# Patient Record
Sex: Female | Born: 1995 | Race: Asian | Hispanic: No | Marital: Single | Smoking: Current every day smoker
Health system: Southern US, Community
[De-identification: ages and names within clinical notes are randomized; demographics above are authoritative.]

## PROBLEM LIST (undated history)

## (undated) ENCOUNTER — Ambulatory Visit

## (undated) ENCOUNTER — Inpatient Hospital Stay (HOSPITAL_COMMUNITY): Payer: Self-pay

## (undated) DIAGNOSIS — Z789 Other specified health status: Secondary | ICD-10-CM

## (undated) HISTORY — PX: MOUTH SURGERY: SHX715

---
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
## 2017-01-12 ENCOUNTER — Encounter: Payer: Medicaid Other | Admitting: Obstetrics and Gynecology

## 2017-01-13 ENCOUNTER — Ambulatory Visit (INDEPENDENT_AMBULATORY_CARE_PROVIDER_SITE_OTHER): Payer: Medicaid Other | Admitting: Obstetrics

## 2017-01-13 DIAGNOSIS — Z3480 Encounter for supervision of other normal pregnancy, unspecified trimester: Secondary | ICD-10-CM

## 2017-01-13 DIAGNOSIS — Z3482 Encounter for supervision of other normal pregnancy, second trimester: Secondary | ICD-10-CM
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
## 2017-01-14 ENCOUNTER — Encounter: Payer: Self-pay | Admitting: Obstetrics

## 2017-01-14 NOTE — Progress Notes (Signed)
Subjective:  Emma Wilcox is a 21 y.o. G3P0020 at [redacted]w[redacted]d being seen today for ongoing prenatal care.  She is currently monitored for the following issues for this low-risk pregnancy and has Supervision of other normal pregnancy, antepartum on her problem list.  Patient reports no complaints.  Contractions: Not present. Vag. Bleeding: None.  Movement: Present. Denies leaking of fluid.   The following portions of the patient's history were reviewed and updated as appropriate: allergies, current medications, past family history, past medical history, past social history, past surgical history and problem list. Problem list updated.  Objective:   Vitals:   01/13/17 1343  BP: 117/68  Pulse: 93  Weight: 162 lb 12.8 oz (73.8 kg)    Fetal Status: Fetal Heart Rate (bpm): 150   Movement: Present     General:  Alert, oriented and cooperative. Patient is in no acute distress.  Skin: Skin is warm and dry. No rash noted.   Cardiovascular: Normal heart rate noted  Respiratory: Normal respiratory effort, no problems with respiration noted  Abdomen: Soft, gravid, appropriate for gestational age. Pain/Pressure: Absent     Pelvic:  Cervical exam deferred        Extremities: Normal range of motion.  Edema: None  Mental Status: Normal mood and affect. Normal behavior. Normal judgment and thought content.   Urinalysis:      Assessment and Plan:  Pregnancy: G3P0020 at [redacted]w[redacted]d  1. Supervision of other normal pregnancy, antepartum   Preterm labor symptoms and general obstetric precautions including but not limited to vaginal bleeding, contractions, leaking of fluid and fetal movement were reviewed in detail with the patient. Please refer to After Visit Summary for other counseling recommendations.  Return in about 2 weeks (around 01/27/2017) for ROB, 2 hour OGTT.   Brock Bad, MD

## 2017-01-27 ENCOUNTER — Encounter: Payer: Medicaid Other | Admitting: Obstetrics

## 2017-01-27 ENCOUNTER — Other Ambulatory Visit: Payer: Medicaid Other

## 2017-02-02 ENCOUNTER — Other Ambulatory Visit: Payer: Medicaid Other

## 2017-02-02 ENCOUNTER — Encounter: Payer: Medicaid Other | Admitting: Obstetrics

## 2017-02-03 ENCOUNTER — Encounter: Payer: Self-pay | Admitting: Obstetrics

## 2017-02-03 ENCOUNTER — Other Ambulatory Visit: Payer: Medicaid Other

## 2017-02-03 ENCOUNTER — Ambulatory Visit (INDEPENDENT_AMBULATORY_CARE_PROVIDER_SITE_OTHER): Payer: Medicaid Other | Admitting: Obstetrics

## 2017-02-03 VITALS — BP 110/73 | HR 105 | Wt 167.2 lb

## 2017-02-03 DIAGNOSIS — Z3480 Encounter for supervision of other normal pregnancy, unspecified trimester: Secondary | ICD-10-CM

## 2017-02-03 DIAGNOSIS — K219 Gastro-esophageal reflux disease without esophagitis: Secondary | ICD-10-CM

## 2017-02-03 DIAGNOSIS — Z3483 Encounter for supervision of other normal pregnancy, third trimester: Secondary | ICD-10-CM

## 2017-02-03 MED ORDER — OMEPRAZOLE 20 MG PO CPDR: 20.0000 mg | DELAYED_RELEASE_CAPSULE | Freq: Two times a day (BID) | ORAL | 5 refills | Status: DC
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
## 2017-02-03 NOTE — Addendum Note (Signed)
Addended by: Lear NgMARTIN, MISTY L on: 02/03/2017 01:27 PM   Modules accepted: Orders

## 2017-02-03 NOTE — Progress Notes (Signed)
Subjective:  Emma Wilcox is a 21 y.o. G3P0020 at 262w2d being seen today for ongoing prenatal care.  She is currently monitored for the following issues for this low-risk pregnancy and has Supervision of other normal pregnancy, antepartum on her problem list.  Patient reports heartburn.  Contractions: Not present. Vag. Bleeding: None.  Movement: Present. Denies leaking of fluid.   The following portions of the patient's history were reviewed and updated as appropriate: allergies, current medications, past family history, past medical history, past social history, past surgical history and problem list. Problem list updated.  Objective:   Vitals:   02/03/17 0840  BP: 110/73  Pulse: (!) 105  Weight: 167 lb 3.2 oz (75.8 kg)    Fetal Status:     Movement: Present     General:  Alert, oriented and cooperative. Patient is in no acute distress.  Skin: Skin is warm and dry. No rash noted.   Cardiovascular: Normal heart rate noted  Respiratory: Normal respiratory effort, no problems with respiration noted  Abdomen: Soft, gravid, appropriate for gestational age. Pain/Pressure: Absent     Pelvic:  Cervical exam deferred        Extremities: Normal range of motion.  Edema: None  Mental Status: Normal mood and affect. Normal behavior. Normal judgment and thought content.   Urinalysis:      Assessment and Plan:  Pregnancy: G3P0020 at [redacted]w[redacted]d  1. Supervision of other normal pregnancy, antepartum Rx: - CBC - Glucose Tolerance, 2 Hours w/1 Hour - HIV antibody - RPR  2. SGA (small for gestational age) Rx: - US MFM OB FOLLOW UP; Future  3. GERD without esophagitis Rx: - omeprazole (PRILOSEC) 20 MG capsule; Take 1 capsule (20 mg total) by mouth 2 (two) times daily before a meal.  Dispense: 60 capsule; Refill: 5   Preterm labor symptoms and general obstetric precautions including but not limited to vaginal bleeding, contractions, leaking of fluid and fetal movement were reviewed in detail with  the patient. Please refer to After Visit Summary for other counseling recommendations.  Return in about 2 weeks (around 02/17/2017) for ROB.   Brock BadHarper, Suriya Kovarik A, MD

## 2017-02-04 LAB — CBC
Hematocrit: 35.4 % (ref 34.0–46.6)
Hemoglobin: 11.6 g/dL (ref 11.1–15.9)
MCH: 32 pg (ref 26.6–33.0)
MCHC: 32.8 g/dL (ref 31.5–35.7)
MCV: 98 fL — ABNORMAL HIGH (ref 79–97)
PLATELETS: 268 10*3/uL (ref 150–379)
RBC: 3.62 x10E6/uL — AB (ref 3.77–5.28)
RDW: 14.4 % (ref 12.3–15.4)
WBC: 12 10*3/uL — AB (ref 3.4–10.8)

## 2017-02-04 LAB — RPR: RPR Ser Ql: NONREACTIVE

## 2017-02-04 LAB — HIV ANTIBODY (ROUTINE TESTING W REFLEX): HIV SCREEN 4TH GENERATION: NONREACTIVE

## 2017-02-04 LAB — GLUCOSE, RANDOM: Glucose: 75 mg/dL (ref 65–99)

## 2017-02-08 ENCOUNTER — Other Ambulatory Visit: Payer: Self-pay | Admitting: Obstetrics

## 2017-02-08 ENCOUNTER — Ambulatory Visit (HOSPITAL_COMMUNITY)
Admission: RE | Admit: 2017-02-08 | Discharge: 2017-02-08 | Disposition: A | Discharge status: Home / Self Care | Payer: Medicaid Other | Source: Ambulatory Visit | Attending: Obstetrics | Admitting: Obstetrics

## 2017-02-08 ENCOUNTER — Other Ambulatory Visit (HOSPITAL_COMMUNITY): Payer: Self-pay | Admitting: *Deleted

## 2017-02-08 ENCOUNTER — Encounter (HOSPITAL_COMMUNITY): Payer: Self-pay

## 2017-02-08 DIAGNOSIS — Z362 Encounter for other antenatal screening follow-up: Secondary | ICD-10-CM | POA: Diagnosis present

## 2017-02-08 DIAGNOSIS — O99333 Smoking (tobacco) complicating pregnancy, third trimester: Secondary | ICD-10-CM | POA: Diagnosis not present

## 2017-02-08 DIAGNOSIS — Z3A30 30 weeks gestation of pregnancy: Secondary | ICD-10-CM | POA: Insufficient documentation

## 2017-02-08 DIAGNOSIS — O26843 Uterine size-date discrepancy, third trimester: Secondary | ICD-10-CM | POA: Insufficient documentation

## 2017-02-08 DIAGNOSIS — Z3689 Encounter for other specified antenatal screening: Secondary | ICD-10-CM

## 2017-03-01 ENCOUNTER — Ambulatory Visit (HOSPITAL_COMMUNITY): Admission: RE | Admit: 2017-03-01 | Payer: Medicaid Other | Source: Ambulatory Visit

## 2017-03-05 ENCOUNTER — Encounter (HOSPITAL_COMMUNITY): Payer: Self-pay

## 2017-03-05 ENCOUNTER — Ambulatory Visit (HOSPITAL_COMMUNITY)
Admission: RE | Admit: 2017-03-05 | Discharge: 2017-03-05 | Disposition: A | Discharge status: Home / Self Care | Payer: Medicaid Other | Source: Ambulatory Visit | Attending: Obstetrics | Admitting: Obstetrics

## 2017-03-05 ENCOUNTER — Other Ambulatory Visit (HOSPITAL_COMMUNITY): Payer: Self-pay | Admitting: Maternal and Fetal Medicine

## 2017-03-05 DIAGNOSIS — O26843 Uterine size-date discrepancy, third trimester: Secondary | ICD-10-CM | POA: Insufficient documentation

## 2017-03-05 DIAGNOSIS — Z3A33 33 weeks gestation of pregnancy: Secondary | ICD-10-CM | POA: Diagnosis not present

## 2017-03-05 DIAGNOSIS — Z3480 Encounter for supervision of other normal pregnancy, unspecified trimester: Secondary | ICD-10-CM

## 2017-03-08 ENCOUNTER — Telehealth: Payer: Self-pay

## 2017-03-08 ENCOUNTER — Other Ambulatory Visit (HOSPITAL_COMMUNITY): Payer: Self-pay | Admitting: *Deleted

## 2017-03-08 NOTE — Telephone Encounter (Signed)
Pt last seen 10/17. Pt never completed 2 hr gtt. Called pt to schedule follow up and lab.

## 2017-03-09 ENCOUNTER — Encounter: Payer: Medicaid Other | Admitting: Obstetrics

## 2017-03-09 ENCOUNTER — Other Ambulatory Visit: Payer: Medicaid Other

## 2017-03-12 ENCOUNTER — Other Ambulatory Visit (HOSPITAL_COMMUNITY): Payer: Self-pay | Admitting: Maternal and Fetal Medicine

## 2017-03-12 ENCOUNTER — Ambulatory Visit (HOSPITAL_COMMUNITY)
Admission: RE | Admit: 2017-03-12 | Discharge: 2017-03-12 | Disposition: A | Discharge status: Home / Self Care | Payer: Medicaid Other | Source: Ambulatory Visit | Attending: Obstetrics | Admitting: Obstetrics

## 2017-03-12 ENCOUNTER — Encounter (HOSPITAL_COMMUNITY): Payer: Self-pay

## 2017-03-12 DIAGNOSIS — O99333 Smoking (tobacco) complicating pregnancy, third trimester: Secondary | ICD-10-CM | POA: Diagnosis present

## 2017-03-12 DIAGNOSIS — O26843 Uterine size-date discrepancy, third trimester: Secondary | ICD-10-CM | POA: Insufficient documentation

## 2017-03-12 DIAGNOSIS — Z3A34 34 weeks gestation of pregnancy: Secondary | ICD-10-CM | POA: Diagnosis not present

## 2017-03-12 DIAGNOSIS — O365930 Maternal care for other known or suspected poor fetal growth, third trimester, not applicable or unspecified: Secondary | ICD-10-CM

## 2017-03-12 DIAGNOSIS — Z3480 Encounter for supervision of other normal pregnancy, unspecified trimester: Secondary | ICD-10-CM

## 2017-03-12 DIAGNOSIS — O09893 Supervision of other high risk pregnancies, third trimester: Secondary | ICD-10-CM | POA: Insufficient documentation

## 2017-03-18 ENCOUNTER — Ambulatory Visit (INDEPENDENT_AMBULATORY_CARE_PROVIDER_SITE_OTHER): Payer: Medicaid Other | Admitting: Obstetrics

## 2017-03-18 ENCOUNTER — Encounter: Payer: Self-pay | Admitting: Obstetrics

## 2017-03-18 ENCOUNTER — Other Ambulatory Visit (HOSPITAL_COMMUNITY)
Admission: RE | Admit: 2017-03-18 | Discharge: 2017-03-18 | Disposition: A | Discharge status: Home / Self Care | Payer: Medicaid Other | Source: Ambulatory Visit | Attending: Obstetrics | Admitting: Obstetrics

## 2017-03-18 ENCOUNTER — Other Ambulatory Visit: Payer: Medicaid Other

## 2017-03-18 VITALS — BP 133/72 | HR 98 | Wt 178.0 lb

## 2017-03-18 DIAGNOSIS — O0993 Supervision of high risk pregnancy, unspecified, third trimester: Secondary | ICD-10-CM

## 2017-03-18 DIAGNOSIS — O0990 Supervision of high risk pregnancy, unspecified, unspecified trimester: Secondary | ICD-10-CM

## 2017-03-18 DIAGNOSIS — N898 Other specified noninflammatory disorders of vagina: Secondary | ICD-10-CM | POA: Insufficient documentation

## 2017-03-18 DIAGNOSIS — Z3480 Encounter for supervision of other normal pregnancy, unspecified trimester: Secondary | ICD-10-CM | POA: Diagnosis not present

## 2017-03-18 DIAGNOSIS — Z113 Encounter for screening for infections with a predominantly sexual mode of transmission: Secondary | ICD-10-CM

## 2017-03-18 LAB — OB RESULTS CONSOLE GBS: STREP GROUP B AG: NEGATIVE

## 2017-03-18 LAB — OB RESULTS CONSOLE GC/CHLAMYDIA: GC PROBE AMP, GENITAL: NEGATIVE

## 2017-03-18 NOTE — Progress Notes (Signed)
Patient reports good fetal movement, denies pain. 

## 2017-03-18 NOTE — Progress Notes (Addendum)
Subjective:  Emma Wilcox is a 21 y.o. G3P0020 at 2184w3d being seen today for ongoing prenatal care.  She is currently monitored for the following issues for this low-risk pregnancy and has Supervision of other normal pregnancy, antepartum on their problem list.  Patient reports heartburn.  Contractions: Not present. Vag. Bleeding: None.  Movement: Present. Denies leaking of fluid.   The following portions of the patient's history were reviewed and updated as appropriate: allergies, current medications, past family history, past medical history, past social history, past surgical history and problem list. Problem list updated.  Objective:   Vitals:   03/18/17 1007  BP: 133/72  Pulse: 98  Weight: 178 lb (80.7 kg)    Fetal Status: Fetal Heart Rate (bpm): 150   Movement: Present     General:  Alert, oriented and cooperative. Patient is in no acute distress.  Skin: Skin is warm and dry. No rash noted.   Cardiovascular: Normal heart rate noted  Respiratory: Normal respiratory effort, no problems with respiration noted  Abdomen: Soft, gravid, appropriate for gestational age. Pain/Pressure: Absent     Pelvic:  Cervical exam deferred        Extremities: Normal range of motion.  Edema: None  Mental Status: Normal mood and affect. Normal behavior. Normal judgment and thought content.   Urinalysis:      Assessment and Plan:  Pregnancy: G3P0020 at 6384w3d  1. Supervision of high risk pregnancy, antepartum Rx: - CBC - Glucose Tolerance, 2 Hours w/1 Hour - Strep Gp B NAA  2. SGA (small for gestational age) - weekly BPP with Dopplers  3. Vaginal discharge Rx: - Cervicovaginal ancillary only  4. Screen for STD (sexually transmitted disease) Rx: - HIV antibody (with reflex) - RPR   Preterm labor symptoms and general obstetric precautions including but not limited to vaginal bleeding, contractions, leaking of fluid and fetal movement were reviewed in detail with the patient. Please refer  to After Visit Summary for other counseling recommendations.  Return in about 2 weeks (around 04/01/2017) for ROB.   Brock BadHarper, Arval Brandstetter A, MD

## 2017-03-19 ENCOUNTER — Encounter (HOSPITAL_COMMUNITY): Payer: Self-pay

## 2017-03-19 ENCOUNTER — Ambulatory Visit (HOSPITAL_COMMUNITY)
Admission: RE | Admit: 2017-03-19 | Discharge: 2017-03-19 | Disposition: A | Discharge status: Home / Self Care | Payer: Medicaid Other | Source: Ambulatory Visit | Attending: Obstetrics | Admitting: Obstetrics

## 2017-03-19 DIAGNOSIS — Z3A35 35 weeks gestation of pregnancy: Secondary | ICD-10-CM | POA: Insufficient documentation

## 2017-03-19 DIAGNOSIS — Z3480 Encounter for supervision of other normal pregnancy, unspecified trimester: Secondary | ICD-10-CM

## 2017-03-19 DIAGNOSIS — O365990 Maternal care for other known or suspected poor fetal growth, unspecified trimester, not applicable or unspecified: Secondary | ICD-10-CM | POA: Diagnosis present

## 2017-03-19 LAB — GLUCOSE TOLERANCE, 2 HOURS W/ 1HR
Glucose, 1 hour: 130 mg/dL (ref 65–179)
Glucose, 2 hour: 130 mg/dL (ref 65–152)
Glucose, Fasting: 75 mg/dL (ref 65–91)

## 2017-03-19 LAB — CERVICOVAGINAL ANCILLARY ONLY
BACTERIAL VAGINITIS: POSITIVE — AB
CANDIDA VAGINITIS: NEGATIVE
CHLAMYDIA, DNA PROBE: NEGATIVE
NEISSERIA GONORRHEA: NEGATIVE
TRICH (WINDOWPATH): NEGATIVE

## 2017-03-19 LAB — CBC
HEMOGLOBIN: 11.5 g/dL (ref 11.1–15.9)
Hematocrit: 34.8 % (ref 34.0–46.6)
MCH: 30.7 pg (ref 26.6–33.0)
MCHC: 33 g/dL (ref 31.5–35.7)
MCV: 93 fL (ref 79–97)
Platelets: 274 10*3/uL (ref 150–379)
RBC: 3.75 x10E6/uL — AB (ref 3.77–5.28)
RDW: 13.6 % (ref 12.3–15.4)
WBC: 12.4 10*3/uL — ABNORMAL HIGH (ref 3.4–10.8)

## 2017-03-19 LAB — HIV ANTIBODY (ROUTINE TESTING W REFLEX): HIV Screen 4th Generation wRfx: NONREACTIVE

## 2017-03-19 LAB — RPR: RPR Ser Ql: NONREACTIVE

## 2017-03-20 ENCOUNTER — Other Ambulatory Visit: Payer: Self-pay | Admitting: Obstetrics

## 2017-03-20 DIAGNOSIS — N760 Acute vaginitis: Principal | ICD-10-CM

## 2017-03-20 DIAGNOSIS — B9689 Other specified bacterial agents as the cause of diseases classified elsewhere: Secondary | ICD-10-CM

## 2017-03-20 LAB — STREP GP B NAA: Strep Gp B NAA: NEGATIVE

## 2017-03-20 MED ORDER — METRONIDAZOLE 500 MG PO TABS: 500.0000 mg | ORAL_TABLET | Freq: Two times a day (BID) | ORAL | 2 refills | Status: DC

## 2017-03-26 ENCOUNTER — Inpatient Hospital Stay (HOSPITAL_COMMUNITY): Admission: RE | Admit: 2017-03-26 | Payer: Medicaid Other | Source: Ambulatory Visit

## 2017-03-31 ENCOUNTER — Ambulatory Visit (INDEPENDENT_AMBULATORY_CARE_PROVIDER_SITE_OTHER): Payer: Medicaid Other | Admitting: Obstetrics

## 2017-03-31 VITALS — BP 125/74 | HR 99 | Wt 182.0 lb

## 2017-03-31 DIAGNOSIS — O0993 Supervision of high risk pregnancy, unspecified, third trimester: Secondary | ICD-10-CM

## 2017-03-31 DIAGNOSIS — O0990 Supervision of high risk pregnancy, unspecified, unspecified trimester: Secondary | ICD-10-CM

## 2017-03-31 NOTE — Progress Notes (Signed)
Pt c/o vaginal pressure.  She states she would like her cervix checked.

## 2017-04-01 ENCOUNTER — Other Ambulatory Visit (HOSPITAL_COMMUNITY): Payer: Self-pay | Admitting: *Deleted

## 2017-04-01 ENCOUNTER — Ambulatory Visit (HOSPITAL_COMMUNITY)
Admission: RE | Admit: 2017-04-01 | Discharge: 2017-04-01 | Disposition: A | Discharge status: Home / Self Care | Payer: Medicaid Other | Source: Ambulatory Visit | Attending: Obstetrics | Admitting: Obstetrics

## 2017-04-01 ENCOUNTER — Other Ambulatory Visit (HOSPITAL_COMMUNITY): Payer: Self-pay | Admitting: Maternal and Fetal Medicine

## 2017-04-01 ENCOUNTER — Encounter: Payer: Self-pay | Admitting: Obstetrics

## 2017-04-01 ENCOUNTER — Encounter (HOSPITAL_COMMUNITY): Payer: Self-pay

## 2017-04-01 DIAGNOSIS — O99333 Smoking (tobacco) complicating pregnancy, third trimester: Secondary | ICD-10-CM | POA: Diagnosis not present

## 2017-04-01 DIAGNOSIS — O365930 Maternal care for other known or suspected poor fetal growth, third trimester, not applicable or unspecified: Secondary | ICD-10-CM

## 2017-04-01 DIAGNOSIS — Z362 Encounter for other antenatal screening follow-up: Secondary | ICD-10-CM

## 2017-04-01 DIAGNOSIS — O26843 Uterine size-date discrepancy, third trimester: Secondary | ICD-10-CM

## 2017-04-01 DIAGNOSIS — Z3480 Encounter for supervision of other normal pregnancy, unspecified trimester: Secondary | ICD-10-CM

## 2017-04-01 DIAGNOSIS — Z3A37 37 weeks gestation of pregnancy: Secondary | ICD-10-CM | POA: Insufficient documentation

## 2017-04-01 NOTE — Progress Notes (Addendum)
Subjective:  Emma Wilcox is a 21 y.o. G3P0020 at [redacted]w[redacted]d being seen today for ongoing prenatal care.  She is currently monitored for the following issues for this low-risk pregnancy and has Supervision of other normal pregnancy, antepartum on their problem list.  Patient reports pelvic pressure.  Contractions: Irregular. Vag. Bleeding: None.  Movement: Present. Denies leaking of fluid.   The following portions of the patient's history were reviewed and updated as appropriate: allergies, current medications, past family history, past medical history, past social history, past surgical history and problem list. Problem list updated.  Objective:   Vitals:   03/31/17 1601  BP: 125/74  Pulse: 99  Weight: 182 lb (82.6 kg)    Fetal Status: Fetal Heart Rate (bpm): 150   Movement: Present     General:  Alert, oriented and cooperative. Patient is in no acute distress.  Skin: Skin is warm and dry. No rash noted.   Cardiovascular: Normal heart rate noted  Respiratory: Normal respiratory effort, no problems with respiration noted  Abdomen: Soft, gravid, appropriate for gestational age. Pain/Pressure: Present     Pelvic:  Cervical exam performed      FT / 50% / -2 / Vtx  Extremities: Normal range of motion.  Edema: Trace  Mental Status: Normal mood and affect. Normal behavior. Normal judgment and thought content.   Urinalysis:          [place] MFM OB FOLLOW UP (Accession [id number]) (Order [id number])  Imaging  Date: 04/01/2017 Department: [facility] MATERNAL FETAL CARE ULTRASOUND Released By: Roselind Messier Authorizing: Particia Nearing, MD  Exam Information   Status Exam Begun  Exam Ended   Final [99] 04/01/2017 1:50 PM 04/01/2017 3:05 PM  PACS Images   Show images for [place] MFM OB FOLLOW UP  Study Result   ----------------------------------------------------------------------  OBSTETRICS REPORT                      (Signed Final 04/01/2017 02:57  pm) ---------------------------------------------------------------------- Patient Info  ID #:       [id number]                          D.O.B.:  Dec 19, 1995 (20 yrs)  Name:       Emma Wilcox                       Visit Date: 04/01/2017 01:49 pm ---------------------------------------------------------------------- Performed By  Performed By:     Vivien Rota        Ref. Address:     [address]                                                             [phone]  [place]                                                             [address]  Attending:        Charlsie Merles MD         Location:         [facility]  Referred By:      Center for                    [facility]                    [facility] - [facility] ---------------------------------------------------------------------- Orders   #  Description                                 Code   1  [place] MFM FETAL BPP WO NON STRESS              76819.01   2  [place] MFM UA CORD DOPPLER                      76820.02   3  [place] MFM OB FOLLOW UP                         19147.82  ----------------------------------------------------------------------   #  Ordered By               Order #        Accession #    Episode #   1  Particia Nearing            [id number]      [id number]     [id number]   2  MARTHA DECKER            [id number]      [id number]     [id number]   3  MARTHA DECKER            [id number]      [id number]     [id number]  ---------------------------------------------------------------------- Indications   [redacted] weeks gestation of pregnancy                Z3A.37   Small for gestational age fetus affecting      O36.5990   management of mother   Tobacco use complicating pregnancy, third      O99.333   trimester   Uterine size-date discrepancy, third trimester O26.843   (S < D)   Encounter for other  antenatal screening        Z36.2   follow-up  ---------------------------------------------------------------------- OB History  Gravidity:    3         Term:   0        Prem:   0        SAB:   1  TOP:          1       Ectopic:  0        Living: 0 ---------------------------------------------------------------------- Fetal Evaluation  Num Of Fetuses:     1  Fetal Heart         141  Rate(bpm):  Cardiac Activity:   Observed  Presentation:       Cephalic  Placenta:           Anterior, above cervical os  P. Cord Insertion:  Previously Visualized  Amniotic Fluid  AFI FV:      Subjectively within normal limits  AFI Sum(cm)     %Tile       Largest Pocket(cm)  20.38           79          7.32  RUQ(cm)       RLQ(cm)       LUQ(cm)        LLQ(cm)  7.32          5.72          4.45           2.89 ---------------------------------------------------------------------- Biophysical Evaluation  Amniotic F.V:   Pocket => 2 cm two         F. Tone:        Observed                  planes  F. Movement:    Observed                   Score:          8/8  F. Breathing:   Observed ---------------------------------------------------------------------- Biometry  BPD:      86.4  mm     G. Age:  34w 6d          8  %    CI:        79.16   %    70 - 86                                                          FL/HC:      20.1   %    20.8 - 22.6  HC:       307   mm     G. Age:  34w 1d        < 3  %    HC/AC:      1.02        0.92 - 1.05  AC:      302.2  mm     G. Age:  34w 2d        < 3  %    FL/BPD:     71.3   %    71 - 87  FL:       61.6  mm     G. Age:  32w 0d        < 3  %    FL/AC:      20.4   %    20 - 24  HUM:      55.8  mm     G. Age:  32w 3d        < 5  %  Est. FW:    2239  gm    4 lb 15 oz    < 10  % ---------------------------------------------------------------------- Gestational Age  LMP:           37w 3d        Date:  07/13/16  EDD:   04/19/17  U/S Today:     33w 6d                                         EDD:   05/14/17  Best:          37w 3d     Det. By:  LMP  (07/13/16)          EDD:   04/19/17 ---------------------------------------------------------------------- Anatomy  Cranium:               Appears normal         Aortic Arch:            Previously seen  Cavum:                 Previously seen        Ductal Arch:            Previously seen  Ventricles:            Previously seen        Diaphragm:              Previously seen  Choroid Plexus:        Previously seen        Stomach:                Appears normal, left                                                                        sided  Cerebellum:            Previously seen        Abdomen:                Appears normal  Posterior Fossa:       Previously seen        Abdominal Wall:         Previously seen  Nuchal Fold:           Previously seen        Cord Vessels:           Previously seen  Face:                  Orbits and profile     Kidneys:                Previously seen                         previously seen  Lips:                  Previously seen        Bladder:                Appears normal  Thoracic:              Appears normal         Spine:                  Previously seen  Heart:  Previously seen        Upper Extremities:      Previously seen  RVOT:                  Previously seen        Lower Extremities:      Previously seen  LVOT:                  Appears normal  Other:  Heels and 5th digit prev visualized. Nasal bone prev visualized. ---------------------------------------------------------------------- Doppler - Fetal Vessels  Umbilical Artery   S/D     %tile     RI              PI              PSV    ADFV    RDFV                                                   (cm/s)  2.72       70   0.63             0.96             46.64      No      No ---------------------------------------------------------------------- Cervix Uterus Adnexa  Cervix  Not visualized (advanced GA  >29wks)  Uterus  No abnormality visualized.  Left Ovary  Within normal limits.  Right Ovary  Within normal limits.  Adnexa:       No abnormality visualized. No adnexal mass                visualized. ---------------------------------------------------------------------- Impression  Singleton intrauterine pregnancy at 37+3 weeks  Interval review of the anatomy shows no sonographic  markers for aneuploidy or structural anomalies  Amniotic fluid volume is normal with an AFI of 20.4 cm  Estimated fetal weight is 2239g which is growth at <10th  percentile  BPP 8/8  Umbilical artery dopplers are normal with no absent or  reversed end-diastolic flow ---------------------------------------------------------------------- Recommendations  Repeat BPP and UA dopplers in 1 week if undelivered ----------------------------------------------------------------------                 Charlsie Merles, MD Electronically Signed Final Report   04/01/2017 02:57 pm ----------------------------------------------------------------------  Kathee Delton Documents:   There are no order-level documents.  Encounter-Level Documents:   There are no encounter-level documents.  Vitals   Height Weight BMI (Calculated)  5\' 4"  (1.626 m) 180 lb 4 oz (81.8 kg) 28.85  Imaging   Imaging Information  Signed by   Signed Date/Time  Phone Pager  Charlsie Merles [facility] 04/01/2017 2:57 PM [phone]   Signed   Electronically signed by Allegra Lai, MD on 04/01/17 at 1457 EST  Original Order   Ordered On Ordered By   03/08/2017 10:01 AM Heidi Dach, RN          Assessment and Plan:  Pregnancy: G3P0020 at [redacted]w[redacted]d  1. Supervision of high risk pregnancy, antepartum   2. SGA (small for gestational age) - EFW < 10th percentile.  Normal fluid.  BPP 8/8. Normal Dopplers - repeat BPP and Dopplers in 1 week, per MFM   Term labor symptoms and general obstetric precautions including but not limited to  vaginal bleeding, contractions, leaking of fluid and fetal movement were  reviewed in detail with the patient. Please refer to After Visit Summary for other counseling recommendations.  Return in about 1 week (around 04/07/2017) for ROB.   Brock BadHarper, Mishel Sans A, MD

## 2017-04-07 ENCOUNTER — Encounter: Payer: Medicaid Other | Admitting: Obstetrics

## 2017-04-08 ENCOUNTER — Encounter (HOSPITAL_COMMUNITY): Payer: Self-pay

## 2017-04-08 ENCOUNTER — Ambulatory Visit (HOSPITAL_COMMUNITY)
Admission: RE | Admit: 2017-04-08 | Discharge: 2017-04-08 | Disposition: A | Discharge status: Home / Self Care | Payer: Medicaid Other | Source: Ambulatory Visit | Attending: Obstetrics | Admitting: Obstetrics

## 2017-04-08 ENCOUNTER — Other Ambulatory Visit (HOSPITAL_COMMUNITY): Payer: Self-pay | Admitting: Obstetrics and Gynecology

## 2017-04-08 DIAGNOSIS — O99333 Smoking (tobacco) complicating pregnancy, third trimester: Secondary | ICD-10-CM | POA: Diagnosis not present

## 2017-04-08 DIAGNOSIS — Z3A38 38 weeks gestation of pregnancy: Secondary | ICD-10-CM | POA: Diagnosis not present

## 2017-04-08 DIAGNOSIS — O365930 Maternal care for other known or suspected poor fetal growth, third trimester, not applicable or unspecified: Secondary | ICD-10-CM | POA: Diagnosis not present

## 2017-04-08 DIAGNOSIS — Z3480 Encounter for supervision of other normal pregnancy, unspecified trimester: Secondary | ICD-10-CM

## 2017-04-08 DIAGNOSIS — O26843 Uterine size-date discrepancy, third trimester: Secondary | ICD-10-CM | POA: Insufficient documentation

## 2017-04-09 ENCOUNTER — Other Ambulatory Visit: Payer: Self-pay | Admitting: Obstetrics

## 2017-04-09 ENCOUNTER — Telehealth: Payer: Self-pay | Admitting: Pediatrics

## 2017-04-09 NOTE — Telephone Encounter (Signed)
Pt called in stating Dr. Sherrie Georgeecker told her she needs to be sch for IOL at 39 weeks.  Per imaging report from yesterday, IOL @ 39 wks per Dr. Sherrie Georgeecker.  Please advise.

## 2017-04-11 ENCOUNTER — Inpatient Hospital Stay (HOSPITAL_COMMUNITY)
Admission: AD | Admit: 2017-04-11 | Discharge: 2017-04-15 | DRG: 807 | Disposition: A | Discharge status: Home / Self Care | Payer: Medicaid Other | Source: Ambulatory Visit | Attending: Obstetrics and Gynecology | Admitting: Obstetrics and Gynecology

## 2017-04-11 ENCOUNTER — Encounter (HOSPITAL_COMMUNITY): Payer: Self-pay

## 2017-04-11 DIAGNOSIS — Z3A38 38 weeks gestation of pregnancy: Secondary | ICD-10-CM

## 2017-04-11 DIAGNOSIS — F17210 Nicotine dependence, cigarettes, uncomplicated: Secondary | ICD-10-CM | POA: Diagnosis present

## 2017-04-11 DIAGNOSIS — O365930 Maternal care for other known or suspected poor fetal growth, third trimester, not applicable or unspecified: Secondary | ICD-10-CM | POA: Diagnosis present

## 2017-04-11 DIAGNOSIS — Z3480 Encounter for supervision of other normal pregnancy, unspecified trimester: Secondary | ICD-10-CM

## 2017-04-11 DIAGNOSIS — O99334 Smoking (tobacco) complicating childbirth: Secondary | ICD-10-CM | POA: Diagnosis present

## 2017-04-11 DIAGNOSIS — Z3490 Encounter for supervision of normal pregnancy, unspecified, unspecified trimester: Secondary | ICD-10-CM | POA: Diagnosis present

## 2017-04-11 MED ORDER — FLEET ENEMA 7-19 GM/118ML RE ENEM: 1.0000 | ENEMA | RECTAL | Status: DC | PRN

## 2017-04-11 MED ORDER — LACTATED RINGERS IV SOLN: 500.0000 mL | INTRAVENOUS | Status: DC | PRN

## 2017-04-11 MED ORDER — OXYCODONE-ACETAMINOPHEN 5-325 MG PO TABS: 1.0000 | ORAL_TABLET | ORAL | Status: DC | PRN

## 2017-04-11 MED ORDER — LIDOCAINE HCL (PF) 1 % IJ SOLN
30.0000 mL | INTRAMUSCULAR | Status: DC | PRN
Start: 2017-04-11 — End: 2017-04-13
  Filled 2017-04-11: qty 30

## 2017-04-11 MED ORDER — OXYTOCIN BOLUS FROM INFUSION
500.0000 mL | Freq: Once | INTRAVENOUS | Status: AC
  Administered 2017-04-13: 500 mL via INTRAVENOUS

## 2017-04-11 MED ORDER — LACTATED RINGERS IV SOLN
INTRAVENOUS | Status: DC
  Administered 2017-04-12 (×5): via INTRAVENOUS

## 2017-04-11 MED ORDER — SOD CITRATE-CITRIC ACID 500-334 MG/5ML PO SOLN
30.0000 mL | ORAL | Status: DC | PRN
  Administered 2017-04-12: 30 mL via ORAL
  Filled 2017-04-11: qty 15

## 2017-04-11 MED ORDER — ACETAMINOPHEN 325 MG PO TABS: 650.0000 mg | ORAL_TABLET | ORAL | Status: DC | PRN

## 2017-04-11 MED ORDER — TERBUTALINE SULFATE 1 MG/ML IJ SOLN
0.2500 mg | Freq: Once | INTRAMUSCULAR | Status: DC | PRN
  Filled 2017-04-11: qty 1

## 2017-04-11 MED ORDER — OXYCODONE-ACETAMINOPHEN 5-325 MG PO TABS: 2.0000 | ORAL_TABLET | ORAL | Status: DC | PRN

## 2017-04-11 MED ORDER — MISOPROSTOL 25 MCG QUARTER TABLET
25.0000 ug | ORAL_TABLET | ORAL | Status: DC | PRN
  Administered 2017-04-12 (×2): 25 ug via VAGINAL
  Filled 2017-04-11 (×3): qty 1

## 2017-04-11 MED ORDER — ONDANSETRON HCL 4 MG/2ML IJ SOLN
4.0000 mg | Freq: Four times a day (QID) | INTRAMUSCULAR | Status: DC | PRN
  Administered 2017-04-12: 4 mg via INTRAVENOUS
  Filled 2017-04-11: qty 2

## 2017-04-11 MED ORDER — OXYTOCIN 40 UNITS IN LACTATED RINGERS INFUSION - SIMPLE MED
2.5000 [IU]/h | INTRAVENOUS | Status: DC
  Administered 2017-04-13: 2.5 [IU]/h via INTRAVENOUS
  Filled 2017-04-11 (×2): qty 1000

## 2017-04-11 NOTE — H&P (Signed)
OBSTETRIC ADMISSION HISTORY AND PHYSICAL  Emma Wilcox is a 21 y.o. female G3P0020 with IUP at 3111w6d by LMP presenting for IOL for FGR <10% and elevated UA dopplers with recommended delivery at 39 weeks by MFM. She reports +FMs, No LOF, no VB, no blurry vision, headaches or peripheral edema, and RUQ pain.  She plans on breast feeding. She plans condoms for birth control. She received her prenatal care at [facility]  Dating: By LMP --->  Estimated Date of Delivery: 04/19/17  Clinic      [facility] Prenatal Labs  Dating       LMP Blood type: A/Positive/-- (07/31 1102)   Genetic Screen 1 Screen:    AFP:     Quad:     NIPS: Antibody:Negative (07/31 1102)  Anatomic US 11-24-16 Rubella: 1.93 (07/31 1102)  GTT Early:               Third trimester:  RPR: Non Reactive (11/29 1035)   Flu vaccine      decline HBsAg: Negative (07/31 1102)   TDaP vaccine             decline                     Rhogam: n/a HIV:   NR   Baby Food                  Breast feed                        YNW:GNFAOZHYGBS:Negative (11/29 0830)(For PCN allergy, check sensitivities)  Contraception                condoms Pap:  Circumcision                     n/a SGA:  EFW < 10th percentile.  Nl. fluid.  Nl. Dopplers  Pediatrician                       Support Person            FOB    Prenatal History/Complications:  Past Medical History: Past Medical History:  Diagnosis Date  . Medical history non-contributory     Past Surgical History: Past Surgical History:  Procedure Laterality Date  . MOUTH SURGERY      Obstetrical History: OB History    Gravida Para Term Preterm AB Living   3       2 0   SAB TAB Ectopic Multiple Live Births   1 1 0         Social History: Social History   Socioeconomic History  . Marital status: Single    Spouse name: None  . Number of children: None  . Years of education: None  . Highest education level: None  Social Needs  . Financial resource strain: None  . Food insecurity - worry: None  . Food  insecurity - inability: None  . Transportation needs - medical: None  . Transportation needs - non-medical: None  Occupational History  . None  Tobacco Use  . Smoking status: Current Every Day Smoker    Packs/day: 0.50    Years: 4.00    Pack years: 2.00  . Smokeless tobacco: Never Used  Substance and Sexual Activity  . Alcohol use: No  . Drug use: Yes    Types: Marijuana  . Sexual activity: Yes    Birth control/protection:  None  Other Topics Concern  . None  Social History Narrative  . None   Family History: Family History  Problem Relation Age of Onset  . Heart disease Mother    Allergies: No Known Allergies  Medications Prior to Admission  Medication Sig Dispense Refill Last Dose  . Cholecalciferol (VITAMIN D) 2000 units CAPS Take 1 capsule (2,000 Units total) by mouth daily after breakfast. 30 capsule 5 Past Week at Unknown time  . metroNIDAZOLE (FLAGYL) 500 MG tablet Take 1 tablet (500 mg total) by mouth 2 (two) times daily. 14 tablet 2 Past Month at Unknown time  . Prenatal Vit-Fe Phos-FA-Omega (VITAFOL GUMMIES) 3.33-0.333-34.8 MG CHEW Chew 3 tablets by mouth daily before breakfast. 90 tablet 11 04/10/2017 at Unknown time  . omeprazole (PRILOSEC) 20 MG capsule Take 1 capsule (20 mg total) by mouth 2 (two) times daily before a meal. (Patient not taking: Reported on 04/08/2017) 60 capsule 5 Not Taking   Review of Systems   All systems reviewed and negative except as stated in HPI  Blood pressure 139/66, pulse 91, temperature 98.7 F (37.1 C), temperature source Oral, resp. rate 18, height 5\' 3"  (1.6 m), weight 180 lb (81.6 kg), last menstrual period 07/13/2016. General appearance: alert, cooperative and no distress Lungs: clear to auscultation bilaterally Heart: regular rate and rhythm Abdomen: soft, non-tender; bowel sounds normal Pelvic: n/a Extremities: Homans sign is negative, no sign of DVT DTR's +2 Presentation: cephalic Fetal monitoringBaseline: 125 bpm,  Variability: Good {> 6 bpm), Accelerations: Reactive and Decelerations: Absent Uterine activityNone Dilation: 1 Effacement (%): 50 Station: -3 Exam by:: Carloyn Jaeger. Dawson CNM   Prenatal labs: ABO, Rh: A/Positive/-- (07/31 1102) Antibody: Negative (07/31 1102) Rubella: 1.93 (07/31 1102) RPR: Non Reactive (11/29 1035)  HBsAg: Negative (07/31 1102)  HIV:   Non reactive  GBS: Negative (11/29 0830)   Prenatal Transfer Tool  Maternal Diabetes: No Genetic Screening: Normal Maternal Ultrasounds/Referrals: Normal Fetal Ultrasounds or other Referrals:  Referred to Materal Fetal Medicine  Maternal Substance Abuse:  No Significant Maternal Medications:  None Significant Maternal Lab Results: Lab values include: Group B Strep negative  No results found for this or any previous visit (from the past 24 hour(s)).  Patient Active Problem List   Diagnosis Date Noted  . Encounter for induction of labor 04/11/2017  . Supervision of other normal pregnancy, antepartum 11/17/2016    Assessment/Plan:  Emma Wilcox is a 21 y.o. G3P0020 at 531w6d here for IOL for FGR and elevated UA dopplers  #Labor: Cytotec for cervical ripening and foley when able #Pain: Plans epidural #FWB: Cat 1 #ID:  GBS neg #MOF: Breast #MOC: condoms #Circ:  n/a  Rolm Bookbinderaroline M Verlin Duke, CNM  04/11/2017, 10:24 PM

## 2017-04-11 NOTE — Telephone Encounter (Signed)
IOL scheduled for 04-18-17.

## 2017-04-11 NOTE — MAU Note (Signed)
Pt feeling lower abdominal pressure and discomfort. Also states she was supposed to have IOL on 12/24.

## 2017-04-12 ENCOUNTER — Inpatient Hospital Stay (HOSPITAL_COMMUNITY): Payer: Medicaid Other | Admitting: Anesthesiology

## 2017-04-12 ENCOUNTER — Encounter (HOSPITAL_COMMUNITY): Payer: Self-pay | Admitting: *Deleted

## 2017-04-12 LAB — CBC
HCT: 35.7 % — ABNORMAL LOW (ref 36.0–46.0)
Hemoglobin: 11.8 g/dL — ABNORMAL LOW (ref 12.0–15.0)
MCH: 30.9 pg (ref 26.0–34.0)
MCHC: 33.1 g/dL (ref 30.0–36.0)
MCV: 93.5 fL (ref 78.0–100.0)
PLATELETS: 289 10*3/uL (ref 150–400)
RBC: 3.82 MIL/uL — ABNORMAL LOW (ref 3.87–5.11)
RDW: 14.2 % (ref 11.5–15.5)
WBC: 14.6 10*3/uL — AB (ref 4.0–10.5)

## 2017-04-12 LAB — TYPE AND SCREEN
ABO/RH(D): A POS
Antibody Screen: NEGATIVE

## 2017-04-12 LAB — RPR: RPR Ser Ql: NONREACTIVE

## 2017-04-12 LAB — ABO/RH: ABO/RH(D): A POS

## 2017-04-12 MED ORDER — BUTORPHANOL TARTRATE 1 MG/ML IJ SOLN
2.0000 mg | Freq: Once | INTRAMUSCULAR | Status: AC
  Administered 2017-04-12: 2 mg via INTRAVENOUS
  Filled 2017-04-12: qty 2

## 2017-04-12 MED ORDER — OXYTOCIN 40 UNITS IN LACTATED RINGERS INFUSION - SIMPLE MED
1.0000 m[IU]/min | INTRAVENOUS | Status: DC
  Administered 2017-04-12: 2 m[IU]/min via INTRAVENOUS

## 2017-04-12 MED ORDER — EPHEDRINE 5 MG/ML INJ
10.0000 mg | INTRAVENOUS | Status: DC | PRN
Start: 2017-04-12 — End: 2017-04-13
  Filled 2017-04-12: qty 2

## 2017-04-12 MED ORDER — LACTATED RINGERS IV SOLN: INTRAVENOUS | Status: DC

## 2017-04-12 MED ORDER — PROMETHAZINE HCL 25 MG/ML IJ SOLN
12.5000 mg | Freq: Once | INTRAMUSCULAR | Status: AC
  Administered 2017-04-12: 12.5 mg via INTRAVENOUS
  Filled 2017-04-12: qty 1

## 2017-04-12 MED ORDER — PHENYLEPHRINE 40 MCG/ML (10ML) SYRINGE FOR IV PUSH (FOR BLOOD PRESSURE SUPPORT)
80.0000 ug | PREFILLED_SYRINGE | INTRAVENOUS | Status: DC | PRN
  Filled 2017-04-12: qty 5

## 2017-04-12 MED ORDER — EPHEDRINE 5 MG/ML INJ
10.0000 mg | INTRAVENOUS | Status: DC | PRN
  Filled 2017-04-12: qty 2

## 2017-04-12 MED ORDER — LACTATED RINGERS IV SOLN
500.0000 mL | Freq: Once | INTRAVENOUS | Status: AC
  Administered 2017-04-12: 500 mL via INTRAVENOUS

## 2017-04-12 MED ORDER — ZOLPIDEM TARTRATE 5 MG PO TABS
5.0000 mg | ORAL_TABLET | Freq: Once | ORAL | Status: AC
  Administered 2017-04-12: 5 mg via ORAL
  Filled 2017-04-12: qty 1

## 2017-04-12 MED ORDER — FENTANYL CITRATE (PF) 100 MCG/2ML IJ SOLN
100.0000 ug | INTRAMUSCULAR | Status: DC | PRN
  Administered 2017-04-12: 100 ug via INTRAVENOUS
  Filled 2017-04-12: qty 2

## 2017-04-12 MED ORDER — DIPHENHYDRAMINE HCL 50 MG/ML IJ SOLN
12.5000 mg | INTRAMUSCULAR | Status: DC | PRN
  Administered 2017-04-12 (×2): 12.5 mg via INTRAVENOUS
  Filled 2017-04-12: qty 1

## 2017-04-12 MED ORDER — LACTATED RINGERS IV SOLN
INTRAVENOUS | Status: DC
  Administered 2017-04-12: 20:00:00 via INTRAUTERINE

## 2017-04-12 MED ORDER — FENTANYL 2.5 MCG/ML BUPIVACAINE 1/10 % EPIDURAL INFUSION (WH - ANES)
14.0000 mL/h | INTRAMUSCULAR | Status: DC | PRN
  Administered 2017-04-12 – 2017-04-13 (×2): 14 mL/h via EPIDURAL
  Filled 2017-04-12 (×2): qty 100

## 2017-04-12 MED ORDER — PHENYLEPHRINE 40 MCG/ML (10ML) SYRINGE FOR IV PUSH (FOR BLOOD PRESSURE SUPPORT)
80.0000 ug | PREFILLED_SYRINGE | INTRAVENOUS | Status: DC | PRN
  Filled 2017-04-12: qty 10
  Filled 2017-04-12: qty 5

## 2017-04-12 MED ORDER — LIDOCAINE HCL (PF) 1 % IJ SOLN
INTRAMUSCULAR | Status: DC | PRN
  Administered 2017-04-12: 13 mL via EPIDURAL

## 2017-04-12 MED ORDER — FENTANYL CITRATE (PF) 100 MCG/2ML IJ SOLN
100.0000 ug | INTRAMUSCULAR | Status: DC | PRN
  Administered 2017-04-12 (×2): 100 ug via INTRAVENOUS
  Filled 2017-04-12 (×2): qty 2

## 2017-04-12 NOTE — Progress Notes (Signed)
Labor Progress Note Emma Wilcox is a 21 y.o. G3P0020 at 8347w0d presented for IOL for SGA and elevated UA dopplers  S:  Patient sleeping, reports no pain  O:  BP 114/68   Pulse 82   Temp 98.2 F (36.8 C) (Oral)   Resp 18   Ht 5\' 3"  (1.6 m)   Wt 180 lb (81.6 kg)   LMP 07/13/2016 (Approximate)   BMI 31.89 kg/m    Fetal Tracing:  Baseline: 125 Variability: moderate Accels: 15x15 Decels: none  Toco: ui   CVE: Dilation: 1.5 Effacement (%): 50 Station: -2 Presentation: Vertex Exam by:: Tracie HarrierBeka Harker, RN   A&P: 21 y.o. G3P0020 447w0d IOL for SGA and elevated UA dopplers #Labor: Discussed with patient foley bulb placement for cervical ripening. Patient refused stating "I just want to sleep." Next dose cytotec due at 0830.  #Pain: n/a #FWB: Cat 1 #GBS negative  Rolm Bookbinderaroline M Neill, CNM 7:50 AM

## 2017-04-12 NOTE — Progress Notes (Addendum)
Tall, female visitor at door of 162 asking for a chair to rest. Other family sleeping in recliners and sofa. Offered clinical stool to FOB. Fob appeared unhappy, turned around and left.  [id number] pt states she feels comfortable with FOB here and safe. She states he is welcome to be in her room at anytime. Pt is asking to eat. Told pt we could address this with providers when they arrive soon for new POC. Explained to pt that it is important to continue with IOL. Pt's friend states pt has had some rest and she is sure pt will be more willing to do foley bulb.

## 2017-04-12 NOTE — Progress Notes (Signed)
Labor Progress Note Emma Wilcox is a 21 y.o. G3P0020 at 543w0d presented for IOL for SGA and elevated UA dopplers  S:  Patient desires to eat. Starting to get hungry and irritated by the process.  O:  BP 119/62   Pulse 82   Temp 97.8 F (36.6 C) (Oral)   Resp 18   Ht 5\' 3"  (1.6 m)   Wt 81.6 kg (180 lb)   LMP 07/13/2016 (Approximate)   BMI 31.89 kg/m    Fetal Tracing:  Baseline: 125 Variability: moderate Accels: 15x15 Decels: none  Toco: 2-4  Pitocin 6 mu/min  CVE: Dilation: 5 Effacement (%): 70 Cervical Position: Posterior Station: -2 Presentation: Vertex Exam by:: Dr. Doroteo GlassmanPhelps   A&P: 21 y.o. G3P0020 673w0d IOL for SGA and elevated UA dopplers  #Labor: s/p FB and cytotec. Pitocin started. Patient AROM'd. In early labor.  #Pain: Plan for epidural.  #FWB: Cat 1 #GBS negative  [name], DO 5:11 PM

## 2017-04-12 NOTE — Progress Notes (Signed)
CNM at bedside to discuss with patient induction plan. Patient refuses any interventions at this time stating she is "tired and wants to sleep." CNM explained importance of induction due to medical reasons and necessity to continue induction throughout the day. Patient refuses any other intervention at this time. Will reassess and discuss foley placement again with next dose of cytotec.  [name], CNM 04/12/17 7:53 AM

## 2017-04-12 NOTE — Anesthesia Procedure Notes (Signed)
Epidural Patient location during procedure: OB Start time: 04/12/2017 5:27 PM End time: 04/12/2017 5:42 PM  Staffing Anesthesiologist: Lowella CurbMiller, Labarron Durnin Ray, MD Performed: anesthesiologist   Preanesthetic Checklist Completed: patient identified, site marked, surgical consent, pre-op evaluation, timeout performed, IV checked, risks and benefits discussed and monitors and equipment checked  Epidural Patient position: sitting Prep: ChloraPrep Patient monitoring: heart rate, cardiac monitor, continuous pulse ox and blood pressure Approach: midline Location: L2-L3 Injection technique: LOR saline  Needle:  Needle type: Tuohy  Needle gauge: 17 G Needle length: 9 cm Needle insertion depth: 6 cm Catheter type: closed end flexible Catheter size: 20 Guage Catheter at skin depth: 10 cm Test dose: negative  Assessment Events: blood not aspirated, injection not painful, no injection resistance, negative IV test and no paresthesia  Additional Notes Reason for block:procedure for pain

## 2017-04-12 NOTE — Progress Notes (Signed)
Pt states foley bulb very painful 9/10. Offered iv pain medicine but reminded she would remain on a clear liquid diet. Pt states she really wants food. Pt hurting and moaning on toilet, passing hard stools. Pt stating how bad uc's hurt. Pt reminded she can have iv pain meds and take jello, icee pops and drinks. Pt yelling with pain and stating-- no I want food!

## 2017-04-12 NOTE — Progress Notes (Signed)
Wants iv pain medication but does not want to get into bed at this time off toilet.

## 2017-04-12 NOTE — Progress Notes (Signed)
CNeill, cnm at bs to see pt and discuss foley bulb labor IOL placement. Pt states she has not has any rest or sleep all night and prefers to get some sleep. Pt is very concerned the room is hot and the temperature need to be addressed. CNeill,cnm called to delivery. RN remained at bs to assess pt.

## 2017-04-12 NOTE — Progress Notes (Signed)
Labor Progress Note Emma Wilcox is a 21 y.o. G3P0020 at 4361w0d presented for IOL for SGA and elevated UA dopplers  S:  Patient uncomfortable with FB. No concerns.   O:  BP 119/69   Pulse 83   Temp (!) 97 F (36.1 C) (Axillary)   Resp 18   Ht 5\' 3"  (1.6 m)   Wt 81.6 kg (180 lb)   LMP 07/13/2016 (Approximate)   BMI 31.89 kg/m    Fetal Tracing:  Baseline: 140 Variability: moderate Accels: 15x15 Decels: none  Toco: 3-4   CVE: Dilation: 1.5 Effacement (%): 80 Cervical Position: Posterior Station: -2 Presentation: Vertex Exam by:: dr Doroteo Glassmanphelps   A&P: 21 y.o. G3P0020 5361w0d IOL for SGA and elevated UA dopplers  #Labor: FB in place still. Continue cytotec. #Pain: Pain medications switched. Hopefully patient will get some rest with FB in place. #FWB: Cat 1 #GBS negative  [name], DO 12:50 PM

## 2017-04-12 NOTE — Anesthesia Preprocedure Evaluation (Signed)
Anesthesia Evaluation  Patient identified by MRN, date of birth, ID band Patient awake    Reviewed: Allergy & Precautions, NPO status , Patient's Chart, lab work & pertinent test results  Airway Mallampati: II  TM Distance: >3 FB Neck ROM: Full    Dental no notable dental hx.    Pulmonary neg pulmonary ROS, Current Smoker,    Pulmonary exam normal breath sounds clear to auscultation       Cardiovascular negative cardio ROS Normal cardiovascular exam Rhythm:Regular Rate:Normal     Neuro/Psych negative neurological ROS  negative psych ROS   GI/Hepatic negative GI ROS, Neg liver ROS,   Endo/Other  negative endocrine ROS  Renal/GU negative Renal ROS  negative genitourinary   Musculoskeletal negative musculoskeletal ROS (+)   Abdominal   Peds negative pediatric ROS (+)  Hematology negative hematology ROS (+)   Anesthesia Other Findings   Reproductive/Obstetrics negative OB ROS (+) Pregnancy                             Anesthesia Physical Anesthesia Plan  ASA: II  Anesthesia Plan: Epidural   Post-op Pain Management:    Induction:   PONV Risk Score and Plan:   Airway Management Planned:   Additional Equipment:   Intra-op Plan:   Post-operative Plan:   Informed Consent:   Plan Discussed with:   Anesthesia Plan Comments:         Anesthesia Quick Evaluation

## 2017-04-12 NOTE — Progress Notes (Signed)
Reports good relief from iv pain medicine. Requests more when its available.

## 2017-04-12 NOTE — Progress Notes (Signed)
Labor Progress Note Emma Wilcox is a 21 y.o. G3P0020 at 5928w0d presented for IOL for SGA and elevated UA dopplers  S:  Patient comfortable. No concerns. Willing to do FB. Wanting to eat.  O:  BP 127/66   Pulse 85   Temp (!) 97 F (36.1 C) (Axillary)   Resp 18   Ht 5\' 3"  (1.6 m)   Wt 81.6 kg (180 lb)   LMP 07/13/2016 (Approximate)   BMI 31.89 kg/m    Fetal Tracing:  Baseline: 130 Variability: moderate Accels: 15x15 Decels: none  Toco: 2-4   CVE: Dilation: 1.5 Effacement (%): 80 Cervical Position: Posterior Station: -2 Presentation: Vertex Exam by:: dr Doroteo Glassmanphelps   A&P: 21 y.o. G3P0020 4628w0d IOL for SGA and elevated UA dopplers  #Labor: FB placed. Continue cytotec as well for induction. #Pain: per maternal request #FWB: Cat 1 #GBS negative  Emma AdaJazma Constance Hackenberg, DO 10:28 AM

## 2017-04-12 NOTE — Progress Notes (Signed)
Labor Progress Note Emma Wilcox is a 21 y.o. G3P0020 at 3971w0d presented for IOL for SGA and elevated UA dopplers  S:  Comfortable with epidural.   O:  BP 131/78   Pulse 98   Temp 98.7 F (37.1 C) (Axillary)   Resp 18   Ht 5\' 3"  (1.6 m)   Wt 81.6 kg (180 lb)   LMP 07/13/2016 (Approximate)   BMI 31.89 kg/m    Fetal Tracing:  Baseline: 125 Variability: moderate Accels: 15x15 Decels: none  Toco: 2-5  Pitocin 2 mu/min  CVE: Dilation: 7 Effacement (%): 90 Cervical Position: Posterior Station: 0, -1 Presentation: Vertex Exam by:: Dr. Emelda FearFerguson   A&P: 21 y.o. G3P0020 6471w0d IOL for SGA and elevated UA dopplers Protracted labor  #Labor: s/p FB and cytotec. AROM'd. Patient with IUPC and FSE. Amnioinfusion started due to recurrent variables. Has been 7cm for about 4-5hrs but unable to increase pitocin. Fetal intolerance to increased pitocin. #Pain: Epidural #FWB: Cat 2 #GBS negative   Dr. Emelda FearFerguson also evaluated. Recheck in 2 hours.   [name], DO 11:14 PM

## 2017-04-12 NOTE — Anesthesia Pain Management Evaluation Note (Signed)
  CRNA Pain Management Visit Note  Patient: Emma Wilcox, 21 y.o., female  "Hello I am a member of the anesthesia team at [facility]. We have an anesthesia team available at all times to provide care throughout the hospital, including epidural management and anesthesia for C-section. I don't know your plan for the delivery whether it a natural birth, water birth, IV sedation, nitrous supplementation, doula or epidural, but we want to meet your pain goals."   1.Was your pain managed to your expectations on prior hospitalizations?   No prior hospitalizations  2.What is your expectation for pain management during this hospitalization?     Epidural  3.How can we help you reach that goal? Epidural when ready. Patient is aware of all pain control options.  Record the patient's initial score and the patient's pain goal.   Pain: 6  Pain Goal: 7 [facility] wants you to be able to say your pain was always managed very well.  Mcihael Hinderman L 04/12/2017

## 2017-04-12 NOTE — Progress Notes (Signed)
Labor Progress Note Emma Wilcox is a 21 y.o. G3P0020 at 284w0d presented for IOL for FGR and elevated UA dopplers  S:  Patient sleeping  O:  BP (!) 110/56 (BP Location: Left Arm)   Pulse 89   Temp 97.6 F (36.4 C) (Oral)   Resp 18   Ht 5\' 3"  (1.6 m)   Wt 180 lb (81.6 kg)   LMP 07/13/2016 (Approximate)   BMI 31.89 kg/m   Fetal Tracing:  Baseline: 120 Variability: moderate Accels: 15x15 Decels: none  Toco: irregular   CVE: Dilation: 1.5 Effacement (%): Thick Station: -2 Presentation: Vertex Exam by:: Emma HarrierBeka Harker, RN    A&P: 21 y.o. G3P0020 594w0d IOL for FGR and elevated UA dopplers #Labor: Continue cytotec. Will attempt foley at next exam. #Pain: n/a #FWB: Cat 1 #GBS negative  Rolm Bookbinderaroline M Mitzi Lilja, CNM 3:21 AM

## 2017-04-13 ENCOUNTER — Encounter (HOSPITAL_COMMUNITY): Payer: Self-pay

## 2017-04-13 DIAGNOSIS — Z3A38 38 weeks gestation of pregnancy: Secondary | ICD-10-CM | POA: Diagnosis not present

## 2017-04-13 MED ORDER — DIBUCAINE 1 % RE OINT: 1.0000 "application " | TOPICAL_OINTMENT | RECTAL | Status: DC | PRN

## 2017-04-13 MED ORDER — PRENATAL MULTIVITAMIN CH
1.0000 | ORAL_TABLET | Freq: Every day | ORAL | Status: DC
  Administered 2017-04-13 – 2017-04-15 (×3): 1 via ORAL
  Filled 2017-04-13 (×3): qty 1

## 2017-04-13 MED ORDER — SENNOSIDES-DOCUSATE SODIUM 8.6-50 MG PO TABS
2.0000 | ORAL_TABLET | ORAL | Status: DC
  Administered 2017-04-13 – 2017-04-14 (×2): 2 via ORAL
  Filled 2017-04-13 (×2): qty 2

## 2017-04-13 MED ORDER — ONDANSETRON HCL 4 MG PO TABS: 4.0000 mg | ORAL_TABLET | ORAL | Status: DC | PRN

## 2017-04-13 MED ORDER — SIMETHICONE 80 MG PO CHEW: 80.0000 mg | CHEWABLE_TABLET | ORAL | Status: DC | PRN

## 2017-04-13 MED ORDER — ONDANSETRON HCL 4 MG/2ML IJ SOLN: 4.0000 mg | INTRAMUSCULAR | Status: DC | PRN

## 2017-04-13 MED ORDER — COCONUT OIL OIL: 1.0000 "application " | TOPICAL_OIL | Status: DC | PRN

## 2017-04-13 MED ORDER — ACETAMINOPHEN 325 MG PO TABS
650.0000 mg | ORAL_TABLET | ORAL | Status: DC | PRN
Start: 2017-04-13 — End: 2017-04-15
  Administered 2017-04-13 – 2017-04-15 (×2): 650 mg via ORAL
  Filled 2017-04-13 (×2): qty 2

## 2017-04-13 MED ORDER — WITCH HAZEL-GLYCERIN EX PADS: 1.0000 "application " | MEDICATED_PAD | CUTANEOUS | Status: DC | PRN

## 2017-04-13 MED ORDER — TETANUS-DIPHTH-ACELL PERTUSSIS 5-2.5-18.5 LF-MCG/0.5 IM SUSP: 0.5000 mL | Freq: Once | INTRAMUSCULAR | Status: DC

## 2017-04-13 MED ORDER — ZOLPIDEM TARTRATE 5 MG PO TABS: 5.0000 mg | ORAL_TABLET | Freq: Every evening | ORAL | Status: DC | PRN

## 2017-04-13 MED ORDER — BENZOCAINE-MENTHOL 20-0.5 % EX AERO
1.0000 "application " | INHALATION_SPRAY | CUTANEOUS | Status: DC | PRN
  Administered 2017-04-14: 1 via TOPICAL
  Filled 2017-04-13: qty 56

## 2017-04-13 MED ORDER — DIPHENHYDRAMINE HCL 25 MG PO CAPS: 25.0000 mg | ORAL_CAPSULE | Freq: Four times a day (QID) | ORAL | Status: DC | PRN

## 2017-04-13 MED ORDER — IBUPROFEN 600 MG PO TABS
600.0000 mg | ORAL_TABLET | Freq: Four times a day (QID) | ORAL | Status: DC
  Administered 2017-04-13 – 2017-04-15 (×10): 600 mg via ORAL
  Filled 2017-04-13 (×8): qty 1

## 2017-04-13 NOTE — Progress Notes (Signed)
Dolphus JennyMia Candelas is a 21 y.o. G3P0020 at 2214w1d  admitted for active labor  Subjective:   Objective: BP 115/64   Pulse 69   Temp 98.6 F (37 C) (Axillary)   Resp 18   Ht 5\' 3"  (1.6 m)   Wt 180 lb (81.6 kg)   LMP 07/13/2016 (Approximate)   BMI 31.89 kg/m  No intake/output data recorded. Total I/O In: -  Out: 550 [Urine:550]  FHT:  FHR: 145 bpm, variability: marked,  accelerations:  Present,  decelerations:  Present Occasional variable decelerations with contractions UC:   irregular, every 3-5 minutes SVE:   Dilation: 8 Effacement (%): 80, 90 Station: +1 Exam by:: Dr. Emelda FearFerguson  Labs: Lab Results  Component Value Date   WBC 14.6 (H) 04/12/2017   HGB 11.8 (L) 04/12/2017   HCT 35.7 (L) 04/12/2017   MCV 93.5 04/12/2017   PLT 289 04/12/2017    Assessment / Plan: Protracted active phase  Labor: We will continue to be cautious in her use of Pitocin to avoid decelerations Preeclampsia:   Fetal Wellbeing:  Category I Pain Control:  Epidural I/D:  n/a Anticipated MOD:  NSVD  Tilda BurrowJohn V Pretty Weltman 04/13/2017, 1:01 AM

## 2017-04-13 NOTE — Anesthesia Postprocedure Evaluation (Signed)
Anesthesia Post Note  Patient: Emma Wilcox  Procedure(s) Performed: AN AD HOC LABOR EPIDURAL     Patient location during evaluation: Mother Baby Anesthesia Type: Epidural Level of consciousness: awake and alert and oriented Pain management: pain level controlled Vital Signs Assessment: post-procedure vital signs reviewed and stable Respiratory status: spontaneous breathing and nonlabored ventilation Cardiovascular status: stable Postop Assessment: no headache, no apparent nausea or vomiting, no backache, adequate PO intake, epidural receding and patient able to bend at knees Anesthetic complications: no    Last Vitals:  Vitals:   04/13/17 0400 04/13/17 0553  BP: 133/60 122/70  Pulse: 84 89  Resp:  18  Temp: 37.2 C 37.4 C    Last Pain:  Vitals:   04/13/17 0715  TempSrc:   PainSc: 0-No pain   Pain Goal: Patients Stated Pain Goal: 7 (04/12/17 0937)               Laban EmperorMalinova,Saliha Salts Hristova

## 2017-04-13 NOTE — Lactation Note (Signed)
This note was copied from a baby's chart. Lactation Consultation Note  Patient Name: Emma Dolphus JennyMia Berland WUJWJ'XToday's Date: 04/13/2017 Reason for consult: Follow-up assessment   Follow up with mom of 13 hour old infant. Mom reports all is well. [facility] at [facility], mom reports no questions/concerns at this time. Enc mom to call out for assistance as needed.    Maternal Data    Feeding Feeding Type: Bottle Fed - Formula  LATCH Score                   Interventions    Lactation Tools Discussed/Used     Consult Status Consult Status: Follow-up Date: 04/14/17 Follow-up type: In-patient    Emma Wilcox 04/13/2017, 4:02 PM

## 2017-04-13 NOTE — Lactation Note (Signed)
This note was copied from a baby's chart. Lactation Consultation Note  Patient Name: Emma Wilcox Reason for consult: Follow-up assessment;1st time breastfeeding   Mom requesting assistance with pumping. Mom was starting to use the pump and then stopped. Mom had the membrane out of the pump. Discussed with mom that membrane is needed for pump function. Mom concerned not much is coming out of the pump. Reviewed milk coming to volume and importance of putting infant to the breast or pumping to stimulate milk production. Mom pumped for about 5 minutes and said I am done with this and took pump off.   Offered to latch infant and mom said no. Worked with mom in hand expression and reviewed importance of hand expression, mom did better with hand expression with practice. Worked with dad on hand expression and he did well. Curved tip syringe usage reviewed to give infant EBM. Infant took 1 ml via finger feeding and curved tip syringe and tolerated it well.   Enc parents to call out for further assistance as needed. Report to CourtlandMaura, Charity fundraiserN.    Maternal Data Has patient been taught Hand Expression?: Yes(worked on with mom and dad)  Feeding Feeding Type: Bottle Fed - Formula  LATCH Score                   Interventions    Lactation Tools Discussed/Used Pump Review: Setup, frequency, and cleaning Initiated by:: Reviewed pump usage and set up   Consult Status Consult Status: Follow-up Date: 04/14/17 Follow-up type: In-patient    Silas FloodSharon S Mikel Hardgrove Wilcox, 5:35 PM

## 2017-04-13 NOTE — Lactation Note (Addendum)
This note was copied from a baby's chart. Lactation Consultation Note Baby 4 hrs old. New mom has lots of questions. Mom stated first thing that she wasn't sure about the breast. She didn't think she can stick with it. Mom stated she will try. Mom wants to pump. Stated that she will probably pump and bottle feed. Mom stated she was going to do both. Mom stated she didn't know anything about of of the different types of formula.  LC encouraged to use the DEBP for stimulation to encouraged milk to come in, and put baby to the breast. Mom stated she didn't have good breast feeding breast. Mom has elongated cone shaped breast. Bulbous areola w/flat small nipple. Hand expression taught w/colostrum noted, spoon feeding reviewed. Breast feel heavy. Encouraged to feel breast before and after BF for transfer. Shells given, strongly encouraged to wear them. Mom shown how to use DEBP & how to disassemble, clean, & reassemble parts. Mom knows to pump q3h for 15-20 min. Mom encouraged to feed baby 8-12 times/24 hours and with feeding cues. Newborn behavior, feeding habits, STS, I&O, cluster feeding, supply and demand. FOB pushing mom to BF, heard some of the conversation w/LC,came into rm.  Mom stated she was sleepy and needed to sleep. LC encouraged mom to wake baby every 3 hrs if hasn't cued before that.   WH/LC brochure given w/resources, support groups and LC services.   Patient Name: Emma Wilcox ZOXWR'UToday's Date: 04/13/2017 Reason for consult: Initial assessment   Maternal Data Has patient been taught Hand Expression?: Yes Does the patient have breastfeeding experience prior to this delivery?: No  Feeding Feeding Type: Breast Fed Length of feed: 5 min  LATCH Score Latch: Repeated attempts needed to sustain latch, nipple held in mouth throughout feeding, stimulation needed to elicit sucking reflex.  Audible Swallowing: None  Type of Nipple: Flat  Comfort (Breast/Nipple): Soft / non-tender  Hold  (Positioning): Assistance needed to correctly position infant at breast and maintain latch.  LATCH Score: 8  Interventions Interventions: Breast feeding basics reviewed;Breast compression;DEBP;Position options;Hand express;Shells  Lactation Tools Discussed/Used Tools: Pump;Shells Shell Type: Inverted Breast pump type: Double-Electric Breast Pump WIC Program: Yes Pump Review: Setup, frequency, and cleaning;Milk Storage Initiated by:: Peri JeffersonL. Zoella Roberti RN IBCLC Date initiated:: 04/13/17   Consult Status Consult Status: Follow-up Date: 04/13/17 Follow-up type: In-patient    Charyl DancerCARVER, Caylin Raby G 04/13/2017, 6:22 AM

## 2017-04-14 MED ORDER — IBUPROFEN 600 MG PO TABS: 600.0000 mg | ORAL_TABLET | Freq: Four times a day (QID) | ORAL | 0 refills | Status: DC

## 2017-04-14 NOTE — Lactation Note (Addendum)
This note was copied from a baby's chart. Lactation Consultation Note  Patient Name: Emma Wilcox Emma Wilcox UJWJX'BToday's Date: 04/14/2017 Reason for consult: Follow-up assessment;Term;Infant < 6lbs  Baby is 5229 hours old  LC reviewed and updated the feeding chart per mom from feeding diary sheet.  Baby has only been taking less 3-4 ml over night and the most volume last evening at  2100 10 ml. Per mom the baby acts like she doesn't like the formula.  Hand expressed and fed with a syringe.   LC recommended if she wants to breast feed - to give the baby practice at the breast  And if she latches let her feed for 15 -20 mins , supplement afterwards, and post pump 10 -15 mins.  Save milk and feed back to baby.  Due to the baby being sluggish with feedings - may have to give the baby an appetizer of EBM or formula  And then latch, if the baby doesn't latch, feed the rest of the supplement, and post pump.   LC recommended since the baby has been so sluggish with feedings, by 9 am to try to feed.     Maternal Data Has patient been taught Hand Expression?: Yes  Feeding Feeding Type: Formula Nipple Type: Slow - flow  LATCH Score                   Interventions Interventions: Breast feeding basics reviewed  Lactation Tools Discussed/Used Tools: Pump Breast pump type: Double-Electric Breast Pump   Consult Status Consult Status: Follow-up Date: 04/14/17 Follow-up type: In-patient    Matilde SprangMargaret Ann Agustine Rossitto 04/14/2017, 8:17 AM

## 2017-04-14 NOTE — Discharge Summary (Signed)
OB Discharge Summary     Patient Name: Emma Wilcox DOB: June 06, 1995 MRN: [id number]  Date of admission: 04/11/2017 Delivering MD: Pincus LargePHELPS, JAZMA Y   Date of discharge: 04/14/2017  Admitting diagnosis: 39 WKS, PAIN, PRESSURE, DIFFICUTY BREATHING Intrauterine pregnancy: 591w1d     Secondary diagnosis:  Active Problems:   Encounter for induction of labor   SVD (spontaneous vaginal delivery)  Additional problems: IOL for fetal growth restriction and elevated dopplers     Discharge diagnosis: Term Pregnancy Delivered                                                                                                Post partum procedures:None  Augmentation: Pitocin and Cytotec  Complications: None  Hospital course:  Induction of Labor With Vaginal Delivery   21 y.o. yo [id number] at 261w1d was admitted to the hospital 04/11/2017 for induction of labor.  Indication for induction: FGR.  Patient had an uncomplicated labor course as follows: Membrane Rupture Time/Date: 5:02 PM ,04/12/2017   Intrapartum Procedures: Episiotomy: None [1]                                         Lacerations:  Labial [10]  Patient had delivery of a Viable infant.  Information for the patient's newborn:  Claris Pongyers, Girl Siren [[id number]][[id number]]  Delivery Method: Vag-Spont   04/13/2017  Details of delivery can be found in separate delivery note.  Patient had a routine postpartum course. Patient is discharged home 04/14/17.  Physical exam  Vitals:   04/13/17 0553 04/13/17 0945 04/13/17 1700 04/14/17 0536  BP: 122/70 126/72 105/70 126/69  Pulse: 89 84 88 79  Resp: 18 18 18    Temp: 99.3 F (37.4 C) 98.9 F (37.2 C) 99.1 F (37.3 C)   TempSrc: Oral Oral Oral   Weight:      Height:       Subjective: Pt reports doing well.  No questions or concerns.  Baby breastfeeding well.  Denies dizziness, shortness of breath, or palpitations.  Desires condoms for family planning.  Adamant about no birth control.    General: alert,  cooperative and appears stated age Lochia: appropriate Uterine Fundus: firm Incision: n/a DVT Evaluation: No evidence of DVT seen on physical exam. Negative Homan's sign.  Labs: Lab Results  Component Value Date   WBC 14.6 (H) 04/12/2017   HGB 11.8 (L) 04/12/2017   HCT 35.7 (L) 04/12/2017   MCV 93.5 04/12/2017   PLT 289 04/12/2017   CMP Latest Ref Rng & Units 02/03/2017  Glucose 65 - 99 mg/dL 75  BUN 6 - 20 mg/dL -  Creatinine 1.300.44 - 8.651.00 mg/dL -  Sodium 784135 - 696145 mmol/L -  Potassium 3.5 - 5.1 mmol/L -  Chloride 101 - 111 mmol/L -  CO2 22 - 32 mmol/L -  Calcium 8.9 - 10.3 mg/dL -  Total Protein 6.5 - 8.1 g/dL -  Total Bilirubin 0.3 - 1.2 mg/dL -  Alkaline Phos 38 - 295126 U/L -  AST 15 - 41 U/L -  ALT 14 - 54 U/L -    Discharge instruction: per After Visit Summary and "Baby and Me Booklet".  After visit meds:  Allergies as of 04/14/2017   No Known Allergies     Medication List    STOP taking these medications   metroNIDAZOLE 500 MG tablet Commonly known as:  FLAGYL   omeprazole 20 MG capsule Commonly known as:  PRILOSEC   Vitamin D 2000 units Caps     TAKE these medications   ibuprofen 600 MG tablet Commonly known as:  ADVIL,MOTRIN Take 1 tablet (600 mg total) by mouth every 6 (six) hours.   VITAFOL GUMMIES 3.33-0.333-34.8 MG Chew Chew 3 tablets by mouth daily before breakfast.       Diet: routine diet  Activity: Advance as tolerated. Pelvic rest for 6 weeks.   Outpatient follow up:4 weeks Follow up Appt: Future Appointments  Date Time Provider Department Center  05/11/2017  9:00 AM Brock BadHarper, Charles A, MD [facility] None   Follow up Visit:No Follow-up on file.  Postpartum contraception: Condoms  Newborn Data: Live born female  Birth Weight: 5 lb 14 oz (2665 g) APGAR: 7, 9  Newborn Delivery   Birth date/time:  04/13/2017 02:19:00 Delivery type:  Vaginal, Spontaneous     Baby Feeding: Bottle and Breast Disposition:home with  mother   04/14/2017 Rochele PagesWalidah Karim, CNM

## 2017-04-14 NOTE — Lactation Note (Addendum)
This note was copied from a baby's chart. Lactation Consultation Note  Patient Name: Emma Wilcox ZOXWR'UToday's Date: 04/14/2017 Reason for consult: Follow-up assessment;Other (Comment)(mom feeding the baby a bottle that she had asked the RN for ; ) LC went back 2nd visit and mom was sitting the baby in front of her with the baby facing out ward and feeding the baby .  LC showed mom how to hold her abby and to PACE feed the baby. LC recommended and instructed mom on the need for her to be able to see the baby's face when she is feeding.  Dad [facility] mom really doesn't want to breast feed, mom did not comment.     Maternal Data Has patient been taught Hand Expression?: Yes  Feeding Feeding Type: Formula Nipple Type: Slow - flow  LATCH Score                   Interventions Interventions: Breast feeding basics reviewed  Lactation Tools Discussed/Used Tools: Pump Breast pump type: Double-Electric Breast Pump   Consult Status Consult Status: Follow-up Date: 04/15/17 Follow-up type: In-patient    Emma Wilcox 04/14/2017, 9:56 AM

## 2017-04-15 MED ORDER — OXYCODONE HCL 5 MG PO TABS
5.0000 mg | ORAL_TABLET | Freq: Four times a day (QID) | ORAL | Status: DC | PRN
  Administered 2017-04-15: 5 mg via ORAL
  Filled 2017-04-15: qty 1

## 2017-04-15 NOTE — Discharge Summary (Signed)
OB Discharge Summary     Patient Name: Emma Wilcox DOB: Aug 15, 1995 MRN: [id number]  Date of admission: 04/11/2017 Delivering MD: Pincus LargePHELPS, Aashritha Miedema Y   Date of discharge: 04/15/2017  Admitting diagnosis: 39 WKS, PAIN, PRESSURE, DIFFICUTY BREATHING Intrauterine pregnancy: 1565w1d     Secondary diagnosis:  Active Problems:   Encounter for induction of labor   SVD (spontaneous vaginal delivery)  Additional problems: IOL for fetal growth restriction and elevated dopplers     Discharge diagnosis: Term Pregnancy Delivered                                                                                                Post partum procedures:None  Augmentation: Pitocin and Cytotec  Complications: None  Hospital course:  Induction of Labor With Vaginal Delivery   21 y.o. yo [id number] at 4265w1d was admitted to the hospital 04/11/2017 for induction of labor.  Indication for induction: FGR.  Patient had an uncomplicated labor course as follows: Membrane Rupture Time/Date: 5:02 PM ,04/12/2017   Intrapartum Procedures: Episiotomy: None [1]                                         Lacerations:  Labial [10]  Patient had delivery of a Viable infant.  Information for the patient's newborn:  Claris Pongyers, Girl Mercadies [[id number]][[id number]]  Delivery Method: Vag-Spont   04/13/2017  Details of delivery can be found in separate delivery note.  Patient had a routine postpartum course. Patient is discharged home 04/15/17.  Physical exam  Vitals:   04/13/17 1700 04/14/17 0536 04/14/17 1941 04/15/17 0536  BP:  126/69 131/66 135/72  Pulse:  79 81 74  Resp: 18  18 18   Temp: 99.1 F (37.3 C)  98.1 F (36.7 C) (!) 97.5 F (36.4 C)  TempSrc: Oral  Oral Oral  Weight:      Height:       Subjective: Pt reports doing well.  No questions or concerns.  Baby breastfeeding well. Stayed yesterday due to baby needing more observation. Denies dizziness, shortness of breath, or palpitations.  Desires condoms for family planning.   Adamant about no birth control.    General: alert, cooperative and appears stated age Lochia: appropriate Uterine Fundus: firm Incision: n/a DVT Evaluation: No evidence of DVT seen on physical exam. Negative Homan's sign.  Labs: Lab Results  Component Value Date   WBC 14.6 (H) 04/12/2017   HGB 11.8 (L) 04/12/2017   HCT 35.7 (L) 04/12/2017   MCV 93.5 04/12/2017   PLT 289 04/12/2017   CMP Latest Ref Rng & Units 02/03/2017  Glucose 65 - 99 mg/dL 75  BUN 6 - 20 mg/dL -  Creatinine 1.300.44 - 8.651.00 mg/dL -  Sodium 784135 - 696145 mmol/L -  Potassium 3.5 - 5.1 mmol/L -  Chloride 101 - 111 mmol/L -  CO2 22 - 32 mmol/L -  Calcium 8.9 - 10.3 mg/dL -  Total Protein 6.5 - 8.1 g/dL -  Total Bilirubin 0.3 - 1.2 mg/dL -  Alkaline Phos 38 - 126 U/L -  AST 15 - 41 U/L -  ALT 14 - 54 U/L -    Discharge instruction: per After Visit Summary and "Baby and Me Booklet".  After visit meds:  Allergies as of 04/15/2017   No Known Allergies     Medication List    STOP taking these medications   metroNIDAZOLE 500 MG tablet Commonly known as:  FLAGYL   omeprazole 20 MG capsule Commonly known as:  PRILOSEC   Vitamin D 2000 units Caps     TAKE these medications   ibuprofen 600 MG tablet Commonly known as:  ADVIL,MOTRIN Take 1 tablet (600 mg total) by mouth every 6 (six) hours.   VITAFOL GUMMIES 3.33-0.333-34.8 MG Chew Chew 3 tablets by mouth daily before breakfast.       Diet: routine diet  Activity: Advance as tolerated. Pelvic rest for 6 weeks.   Outpatient follow up:4 weeks Follow up Appt: Future Appointments  Date Time Provider Department Center  05/11/2017  9:00 AM Brock BadHarper, Charles A, MD [facility] None   Follow up Visit:No Follow-up on file.  Postpartum contraception: Condoms  Newborn Data: Live born female  Birth Weight: 5 lb 14 oz (2665 g) APGAR: 7, 9  Newborn Delivery   Birth date/time:  04/13/2017 02:19:00 Delivery type:  Vaginal, Spontaneous     Baby Feeding:  Bottle and Breast Disposition:home with mother   04/15/2017 Caryl AdaJazma Taira Knabe, DO

## 2017-04-15 NOTE — Lactation Note (Signed)
This note was copied from a baby's chart. Lactation Consultation Note: Mom has just pumped whitish milk- pleased she has obtained so much. Reports breasts feel softer after pumping. Has [employer] pump for home. Reports pain with initial latch that then eases off after a few minutes. States she plans to latch baby or pump and bottle feed EBM. Reviewed importance of frequent nursing or pumping to prevent engorgement. Reviewed engorgement prevention and treatment. Reviewed our phone number, OP appointments and BFSG as resources for support after DC. No questions at present. To call prn  Patient Name: Emma Wilcox ZOXWR'UToday's Date: 04/15/2017 Reason for consult: Follow-up assessment;Infant < 6lbs;Primapara   Maternal Data Has patient been taught Hand Expression?: Yes Does the patient have breastfeeding experience prior to this delivery?: No  Feeding Feeding Type: Bottle Fed - Formula Nipple Type: Slow - flow Length of feed: 30 min  LATCH Score                   Interventions    Lactation Tools Discussed/Used     Consult Status Consult Status: Complete    Pamelia HoitWeeks, Willamina Grieshop D 04/15/2017, 9:11 AM

## 2017-04-18 ENCOUNTER — Inpatient Hospital Stay (HOSPITAL_COMMUNITY): Payer: No Typology Code available for payment source

## 2017-05-11 ENCOUNTER — Ambulatory Visit: Payer: Medicaid Other | Admitting: Obstetrics

## 2017-05-21 ENCOUNTER — Ambulatory Visit: Payer: Medicaid Other | Admitting: Obstetrics

## 2017-08-31 ENCOUNTER — Other Ambulatory Visit: Payer: Self-pay

## 2017-08-31 ENCOUNTER — Ambulatory Visit (HOSPITAL_COMMUNITY)
Admission: EM | Admit: 2017-08-31 | Discharge: 2017-08-31 | Disposition: A | Discharge status: Home / Self Care | Payer: Medicaid Other | Attending: Family Medicine | Admitting: Family Medicine

## 2017-08-31 ENCOUNTER — Encounter (HOSPITAL_COMMUNITY): Payer: Self-pay | Admitting: Emergency Medicine

## 2017-08-31 DIAGNOSIS — N898 Other specified noninflammatory disorders of vagina: Secondary | ICD-10-CM | POA: Insufficient documentation

## 2017-08-31 DIAGNOSIS — Z79899 Other long term (current) drug therapy: Secondary | ICD-10-CM | POA: Diagnosis not present

## 2017-08-31 DIAGNOSIS — F17210 Nicotine dependence, cigarettes, uncomplicated: Secondary | ICD-10-CM | POA: Diagnosis not present

## 2017-08-31 LAB — POCT URINALYSIS DIP (DEVICE)
Bilirubin Urine: NEGATIVE
GLUCOSE, UA: NEGATIVE mg/dL
Hgb urine dipstick: NEGATIVE
Ketones, ur: NEGATIVE mg/dL
NITRITE: NEGATIVE
Protein, ur: NEGATIVE mg/dL
Specific Gravity, Urine: >=1.03 (ref 1.005–1.030)
UROBILINOGEN UA: 0.2 mg/dL (ref 0.0–1.0)
pH: 5 (ref 5.0–8.0)

## 2017-08-31 LAB — POCT PREGNANCY, URINE: PREG TEST UR: NEGATIVE

## 2017-08-31 MED ORDER — FLUCONAZOLE 150 MG PO TABS: 150.0000 mg | ORAL_TABLET | Freq: Every day | ORAL | 0 refills | Status: DC

## 2017-08-31 NOTE — Discharge Instructions (Addendum)
You were treated empirically for yeast. Diflucan as directed. Cytology sent, you will be contacted with any positive results that requires further treatment. Refrain from sexual activity and alcohol use for the next 7 days. Monitor for any worsening of symptoms, fever, abdominal pain, nausea, vomiting, to follow up for reevaluation. ° °

## 2017-08-31 NOTE — ED Provider Notes (Signed)
[facility]    CSN: [id number] Arrival date & time: 08/31/17  1520     History   Chief Complaint Chief Complaint  Patient presents with  . Vaginal Discharge    HPI Emma Wilcox is a 22 y.o. female.   22 year old female comes in for 3 day history of vaginal itching with discharge.  She states vaginal discharge is white, without odor.  She thinks there may be a cut at the groin area as it burns in that area when she urinates, but denies dysuria, urgency, frequency, dysuria.  Denies spotting, bleeding.  LMP 07/22/2017.  Sexually active with one female partner, no condom use.  No birth control use.  Not breast-feeding.  States when symptoms first started, she took a leftover antibiotic that was for an abscess she had without improvement.     Past Medical History:  Diagnosis Date  . Medical history non-contributory     Patient Active Problem List   Diagnosis Date Noted  . SVD (spontaneous vaginal delivery) 04/13/2017  . Encounter for induction of labor 04/11/2017  . Supervision of other normal pregnancy, antepartum 11/17/2016    Past Surgical History:  Procedure Laterality Date  . MOUTH SURGERY      OB History    Gravida  3   Para  1   Term  1   Preterm      AB  2   Living  1     SAB  1   TAB  1   Ectopic  0   Multiple  0   Live Births  1            Home Medications    Prior to Admission medications   Medication Sig Start Date End Date Taking? Authorizing Provider  fluconazole (DIFLUCAN) 150 MG tablet Take 1 tablet (150 mg total) by mouth daily. Take second dose 72 hours later if symptoms still persists. 08/31/17   Cathie Hoops, Robbin Loughmiller V, PA-C  ibuprofen (ADVIL,MOTRIN) 600 MG tablet Take 1 tablet (600 mg total) by mouth every 6 (six) hours. 04/14/17   Marlis Edelson, CNM  Prenatal Vit-Fe Phos-FA-Omega (VITAFOL GUMMIES) 3.33-0.333-34.8 MG CHEW Chew 3 tablets by mouth daily before breakfast. 11/17/16   Brock Bad, MD    Family History Family  History  Problem Relation Age of Onset  . Heart disease Mother   . Healthy Father     Social History Social History   Tobacco Use  . Smoking status: Current Every Day Smoker    Packs/day: 0.50    Years: 4.00    Pack years: 2.00  . Smokeless tobacco: Never Used  Substance Use Topics  . Alcohol use: No  . Drug use: Yes    Types: Marijuana     Allergies   Patient has no known allergies.   Review of Systems Review of Systems  Reason unable to perform ROS: See HPI as above.     Physical Exam Triage Vital Signs ED Triage Vitals  Enc Vitals Group     BP 08/31/17 1533 112/72     Pulse Rate 08/31/17 1533 87     Resp --      Temp 08/31/17 1533 98.4 F (36.9 C)     Temp src --      SpO2 08/31/17 1533 99 %     Weight --      Height --      Head Circumference --      Peak Flow --  Pain Score 08/31/17 1536 0     Pain Loc --      Pain Edu? --      Excl. in GC? --    No data found.  Updated Vital Signs BP 112/72 (BP Location: Left Arm)   Pulse 87   Temp 98.4 F (36.9 C)   LMP 07/22/2017 (Approximate)   SpO2 99%   Breastfeeding? No   Physical Exam  Constitutional: She is oriented to person, place, and time. She appears well-developed and well-nourished. No distress.  HENT:  Head: Normocephalic and atraumatic.  Eyes: Pupils are equal, round, and reactive to light. Conjunctivae are normal.  Cardiovascular: Normal rate, regular rhythm and normal heart sounds. Exam reveals no gallop and no friction rub.  No murmur heard. Pulmonary/Chest: Effort normal and breath sounds normal. She has no wheezes. She has no rales.  Abdominal: Soft. Bowel sounds are normal. She exhibits no mass. There is no tenderness. There is no rebound, no guarding and no CVA tenderness.  Genitourinary:    Cervix exhibits no discharge. Vaginal discharge found.  Neurological: She is alert and oriented to person, place, and time.  Skin: Skin is warm and dry.  Psychiatric: She has a normal  mood and affect. Her behavior is normal. Judgment normal.     UC Treatments / Results  Labs (all labs ordered are listed, but only abnormal results are displayed) Labs Reviewed  POCT URINALYSIS DIP (DEVICE) - Abnormal; Notable for the following components:      Result Value   Leukocytes, UA TRACE (*)    All other components within normal limits  URINE CULTURE  POCT PREGNANCY, URINE  CERVICOVAGINAL ANCILLARY ONLY    EKG None  Radiology No results found.  Procedures Procedures (including critical care time)  Medications Ordered in UC Medications - No data to display  Initial Impression / Assessment and Plan / UC Course  I have reviewed the triage vital signs and the nursing notes.  Pertinent labs & imaging results that were available during my care of the patient were reviewed by me and considered in my medical decision making (see chart for details).    Patient was treated empirically for yeast. Start diflucan as directed. Cytology sent, patient will be contacted with any positive results that require additional treatment. Patient to refrain from sexual activity for the next 7 days. Return precautions given.   Final Clinical Impressions(s) / UC Diagnoses   Final diagnoses:  Vaginal discharge    ED Prescriptions    Medication Sig Dispense Auth. Provider   fluconazole (DIFLUCAN) 150 MG tablet Take 1 tablet (150 mg total) by mouth daily. Take second dose 72 hours later if symptoms still persists. 2 tablet Threasa Alpha, PA-C 08/31/17 1714

## 2017-08-31 NOTE — ED Triage Notes (Addendum)
Pt reports a white, clear vaginal discharge. She denies any other symptoms.

## 2017-09-01 LAB — URINE CULTURE

## 2017-09-02 ENCOUNTER — Telehealth (HOSPITAL_COMMUNITY): Payer: Self-pay

## 2017-09-02 LAB — CERVICOVAGINAL ANCILLARY ONLY
Bacterial vaginitis: NEGATIVE
CANDIDA VAGINITIS: POSITIVE — AB
CHLAMYDIA, DNA PROBE: POSITIVE — AB
Neisseria Gonorrhea: NEGATIVE
TRICH (WINDOWPATH): NEGATIVE

## 2017-09-02 MED ORDER — AZITHROMYCIN 250 MG PO TABS: 1000.0000 mg | ORAL_TABLET | Freq: Once | ORAL | 0 refills | Status: AC

## 2017-09-02 NOTE — Telephone Encounter (Signed)
Chlamydia is positive.  Rx po zithromax 1g #1 dose no refills was sent to the pharmacy of record.  Pt contacted and made aware, educated to please refrain from sexual intercourse for 7 days to give the medicine time to work, sexual partners need to be notified and tested/treated.  Condoms may reduce risk of reinfection.  Recheck or followup with PCP for further evaluation if symptoms are not improving.   [facility] notified  Candida (yeast) is positive.  Prescription for fluconazole was given at the urgent care visit.  Pt called and made aware. Recheck or followup with PCP for further evaluation if symptoms are not improving. Answered all questions.

## 2018-03-01 ENCOUNTER — Ambulatory Visit (HOSPITAL_COMMUNITY)
Admission: EM | Admit: 2018-03-01 | Discharge: 2018-03-01 | Disposition: A | Discharge status: Home / Self Care | Payer: Medicaid Other | Attending: Family Medicine | Admitting: Family Medicine

## 2018-03-01 ENCOUNTER — Encounter (HOSPITAL_COMMUNITY): Payer: Self-pay | Admitting: Emergency Medicine

## 2018-03-01 ENCOUNTER — Other Ambulatory Visit: Payer: Self-pay

## 2018-03-01 DIAGNOSIS — Z113 Encounter for screening for infections with a predominantly sexual mode of transmission: Secondary | ICD-10-CM | POA: Diagnosis not present

## 2018-03-01 DIAGNOSIS — F17210 Nicotine dependence, cigarettes, uncomplicated: Secondary | ICD-10-CM | POA: Diagnosis not present

## 2018-03-01 DIAGNOSIS — N898 Other specified noninflammatory disorders of vagina: Secondary | ICD-10-CM | POA: Diagnosis present

## 2018-03-01 DIAGNOSIS — N760 Acute vaginitis: Secondary | ICD-10-CM | POA: Diagnosis not present

## 2018-03-01 MED ORDER — FLUCONAZOLE 150 MG PO TABS: 150.0000 mg | ORAL_TABLET | Freq: Every day | ORAL | 0 refills | Status: DC

## 2018-03-01 NOTE — ED Provider Notes (Signed)
[facility]    CSN: [id number] Arrival date & time: 03/01/18  1510     History   Chief Complaint Chief Complaint  Patient presents with  . SEXUALLY TRANSMITTED DISEASE    HPI Emma Wilcox is a 22 y.o. female.   She is a 22 year old female presents for 3 days of vaginal discharge.  She reports the discharge as thick, somewhat malodorous with itching and irritation.  She does have a history of yeast infections.  She does admit to recently changing the soap that she uses.  She is currently sexually active with one partner, unprotected.  She is requesting testing for STDs.  She denies any associated dysuria, hematuria, urinary frequency, vaginal bleeding, abdominal pain, back pain, fevers.   ROS per HPI      Past Medical History:  Diagnosis Date  . Medical history non-contributory     Patient Active Problem List   Diagnosis Date Noted  . SVD (spontaneous vaginal delivery) 04/13/2017  . Encounter for induction of labor 04/11/2017  . Supervision of other normal pregnancy, antepartum 11/17/2016    Past Surgical History:  Procedure Laterality Date  . MOUTH SURGERY      OB History    Gravida  3   Para  1   Term  1   Preterm      AB  2   Living  1     SAB  1   TAB  1   Ectopic  0   Multiple  0   Live Births  1            Home Medications    Prior to Admission medications   Medication Sig Start Date End Date Taking? Authorizing Provider  fluconazole (DIFLUCAN) 150 MG tablet Take 1 tablet (150 mg total) by mouth daily. Take second dose 72 hours later if symptoms still persists. 03/01/18   Dahlia Byes A, NP  ibuprofen (ADVIL,MOTRIN) 600 MG tablet Take 1 tablet (600 mg total) by mouth every 6 (six) hours. 04/14/17   Amedeo Gory, CNM  Prenatal Vit-Fe Phos-FA-Omega (VITAFOL GUMMIES) 3.33-0.333-34.8 MG CHEW Chew 3 tablets by mouth daily before breakfast. 11/17/16   Brock Bad, MD    Family History Family History  Problem  Relation Age of Onset  . Heart disease Mother   . Healthy Father     Social History Social History   Tobacco Use  . Smoking status: Current Every Day Smoker    Packs/day: 0.50    Years: 4.00    Pack years: 2.00  . Smokeless tobacco: Never Used  Substance Use Topics  . Alcohol use: Yes  . Drug use: Yes    Types: Marijuana     Allergies   Patient has no known allergies.   Review of Systems Review of Systems   Physical Exam Triage Vital Signs ED Triage Vitals  Enc Vitals Group     BP 03/01/18 1548 119/61     Pulse Rate 03/01/18 1548 74     Resp 03/01/18 1548 18     Temp 03/01/18 1548 98 F (36.7 C)     Temp Source 03/01/18 1548 Oral     SpO2 03/01/18 1548 100 %     Weight --      Height --      Head Circumference --      Peak Flow --      Pain Score 03/01/18 1546 0     Pain Loc --  Pain Edu? --      Excl. in GC? --    No data found.  Updated Vital Signs BP 119/61 (BP Location: Left Arm)   Pulse 74   Temp 98 F (36.7 C) (Oral)   Resp 18   LMP 02/06/2018   SpO2 100%   Visual Acuity Right Eye Distance:   Left Eye Distance:   Bilateral Distance:    Right Eye Near:   Left Eye Near:    Bilateral Near:     Physical Exam  Constitutional: She appears well-developed and well-nourished.  Very pleasant. Non toxic or ill appearing.   HENT:  Head: Normocephalic and atraumatic.  Eyes: Conjunctivae are normal.  Neck: Normal range of motion.  Pulmonary/Chest: Effort normal.  Abdominal: Soft. Bowel sounds are normal.  Abdomen soft, non tender. No CVA tenderness. No rebound tenderness.   Musculoskeletal: Normal range of motion.  Neurological: She is alert.  Skin: Skin is warm and dry.  Psychiatric: She has a normal mood and affect.  Nursing note and vitals reviewed.    UC Treatments / Results  Labs (all labs ordered are listed, but only abnormal results are displayed) Labs Reviewed  CERVICOVAGINAL ANCILLARY ONLY    EKG None  Radiology No  results found.  Procedures Procedures (including critical care time)  Medications Ordered in UC Medications - No data to display  Initial Impression / Assessment and Plan / UC Course  I have reviewed the triage vital signs and the nursing notes.  Pertinent labs & imaging results that were available during my care of the patient were reviewed by me and considered in my medical decision making (see chart for details).     We will go ahead and treat today for yeast infection based on symptoms and history. Self swab sent for further testing  Final Clinical Impressions(s) / UC Diagnoses   Final diagnoses:  Vaginitis and vulvovaginitis     Discharge Instructions     We will go ahead and treat you for a yeast infection today based on symptoms We are sending your swab for further testing of STDs. We will call with any positive results    ED Prescriptions    Medication Sig Dispense Auth. Provider   fluconazole (DIFLUCAN) 150 MG tablet Take 1 tablet (150 mg total) by mouth daily. Take second dose 72 hours later if symptoms still persists. 2 tablet Dahlia Byes A, NP     Controlled Substance Prescriptions [place] Controlled Substance Registry consulted? Not Applicable   Janace Aris, NP 03/01/18 1656

## 2018-03-01 NOTE — ED Triage Notes (Signed)
Vaginal discharge for 3 days.  Denies burning with urination.  No abdominal pain or back pain.

## 2018-03-01 NOTE — Discharge Instructions (Signed)
We will go ahead and treat you for a yeast infection today based on symptoms We are sending your swab for further testing of STDs. We will call with any positive results

## 2018-03-02 LAB — CERVICOVAGINAL ANCILLARY ONLY
BACTERIAL VAGINITIS: POSITIVE — AB
CANDIDA VAGINITIS: POSITIVE — AB
Chlamydia: NEGATIVE
Neisseria Gonorrhea: POSITIVE — AB
Trichomonas: POSITIVE — AB

## 2018-03-03 ENCOUNTER — Telehealth (HOSPITAL_COMMUNITY): Payer: Self-pay

## 2018-03-03 MED ORDER — METRONIDAZOLE 500 MG PO TABS: 500.0000 mg | ORAL_TABLET | Freq: Two times a day (BID) | ORAL | 0 refills | Status: DC

## 2018-03-03 NOTE — Telephone Encounter (Signed)
Bacterial vaginosis is positive. This was not treated at the urgent care visit.  Flagyl 500 mg BID x 7 days #14 no refills sent to patients pharmacy of choice.    Candida (yeast) is positive.  Prescription for fluconazole was given at the urgent care visit.    Gonorrhea is positive.  Patient should return as soon as possible to the urgent care for treatment with IM rocephin 250mg  and po zithromax 1g. Patient will not need to see a provider unless there are new symptoms she would like evaluated. Pt needs education to refrain from sexual intercourse for now and for 7 days after treatment to give the medicine time to work. Sexual partners need to be notified and tested/treated. Condoms may reduce risk of reinfection. [facility] notified.   Trichomonas is positive. Treated with rx for flagyl for BV. Pt needs education to refrain from sexual intercourse for 7 days to give the medicine time to work. Sexual partners need to be notified and tested/treated. Condoms may reduce risk of reinfection. Recheck for further evaluation if symptoms are not improving.   Number provided incorrect. Letter sent to patient.

## 2018-04-02 ENCOUNTER — Telehealth (HOSPITAL_COMMUNITY): Payer: Self-pay | Admitting: Emergency Medicine

## 2018-04-02 MED ORDER — METRONIDAZOLE 500 MG PO TABS: 500.0000 mg | ORAL_TABLET | Freq: Two times a day (BID) | ORAL | 0 refills | Status: DC

## 2018-04-02 NOTE — Telephone Encounter (Signed)
Pt called leaving voicemail. Given all information about results. Pt will return here for treatment. Requesting prescription for flagyl when she visits today for treatment.

## 2018-04-03 ENCOUNTER — Ambulatory Visit (HOSPITAL_COMMUNITY)
Admission: EM | Admit: 2018-04-03 | Discharge: 2018-04-03 | Disposition: A | Discharge status: Home / Self Care | Payer: Medicaid Other | Attending: Internal Medicine | Admitting: Internal Medicine

## 2018-04-03 ENCOUNTER — Encounter (HOSPITAL_COMMUNITY): Payer: Self-pay | Admitting: Emergency Medicine

## 2018-04-03 ENCOUNTER — Other Ambulatory Visit: Payer: Self-pay

## 2018-04-03 DIAGNOSIS — Z113 Encounter for screening for infections with a predominantly sexual mode of transmission: Secondary | ICD-10-CM

## 2018-04-03 DIAGNOSIS — Z202 Contact with and (suspected) exposure to infections with a predominantly sexual mode of transmission: Secondary | ICD-10-CM | POA: Insufficient documentation

## 2018-04-03 DIAGNOSIS — Z3202 Encounter for pregnancy test, result negative: Secondary | ICD-10-CM

## 2018-04-03 DIAGNOSIS — F17210 Nicotine dependence, cigarettes, uncomplicated: Secondary | ICD-10-CM | POA: Insufficient documentation

## 2018-04-03 DIAGNOSIS — R110 Nausea: Secondary | ICD-10-CM

## 2018-04-03 DIAGNOSIS — N939 Abnormal uterine and vaginal bleeding, unspecified: Secondary | ICD-10-CM | POA: Insufficient documentation

## 2018-04-03 DIAGNOSIS — N938 Other specified abnormal uterine and vaginal bleeding: Secondary | ICD-10-CM

## 2018-04-03 LAB — HCG, QUANTITATIVE, PREGNANCY: hCG, Beta Chain, Quant, S: 1 m[IU]/mL (ref <5)

## 2018-04-03 LAB — POCT I-STAT, CHEM 8
BUN: 12 mg/dL (ref 6–20)
CREATININE: 0.8 mg/dL (ref 0.44–1.00)
Calcium, Ion: 1.21 mmol/L (ref 1.15–1.40)
Chloride: 107 mmol/L (ref 98–111)
Glucose, Bld: 85 mg/dL (ref 70–99)
HCT: 41 % (ref 36.0–46.0)
Hemoglobin: 13.9 g/dL (ref 12.0–15.0)
Potassium: 3.5 mmol/L (ref 3.5–5.1)
Sodium: 143 mmol/L (ref 135–145)
TCO2: 27 mmol/L (ref 22–32)

## 2018-04-03 MED ORDER — MEGESTROL ACETATE 40 MG PO TABS: 40.0000 mg | ORAL_TABLET | Freq: Two times a day (BID) | ORAL | 0 refills | Status: DC

## 2018-04-03 MED ORDER — AZITHROMYCIN 250 MG PO TABS
ORAL_TABLET | ORAL | Status: AC
  Filled 2018-04-03: qty 4

## 2018-04-03 MED ORDER — CEFTRIAXONE SODIUM 250 MG IJ SOLR
INTRAMUSCULAR | Status: AC
  Filled 2018-04-03: qty 250

## 2018-04-03 MED ORDER — AZITHROMYCIN 250 MG PO TABS
1000.0000 mg | ORAL_TABLET | Freq: Once | ORAL | Status: AC
  Administered 2018-04-03: 1000 mg via ORAL

## 2018-04-03 MED ORDER — CEFTRIAXONE SODIUM 250 MG IJ SOLR
250.0000 mg | Freq: Once | INTRAMUSCULAR | Status: AC
  Administered 2018-04-03: 250 mg via INTRAMUSCULAR

## 2018-04-03 NOTE — ED Provider Notes (Signed)
[facility]    CSN: [id number] Arrival date & time: 04/03/18  1408     History   Chief Complaint Chief Complaint  Patient presents with  . Exposure to STD  . Appointment 2:15    HPI Emma Wilcox is a [age] y.o. female no significant past medical history presenting today for evaluation of STD exposure and vaginal bleeding.  Patient states that she has had vaginal bleeding for the past 2 weeks.  She notes that is been heavy and has been passing clots.  She notes that prior to this she was approximately 2 months late on her menstrual cycle.  She has had associated abdominal cramping.  She also notes that her partner recently told her that they tested positive for gonorrhea.  She is unsure of any abnormal discharge prior to onset of bleeding.  Has had occasional nausea, one episode of vomiting, but no persistent vomiting.  Denies fevers.  Pain in abdomen is relatively improved from her symptoms earlier this week.  HPI  Past Medical History:  Diagnosis Date  . Medical history non-contributory     Patient Active Problem List   Diagnosis Date Noted  . SVD (spontaneous vaginal delivery) 04/13/2017  . Encounter for induction of labor 04/11/2017  . Supervision of other normal pregnancy, antepartum 11/17/2016    Past Surgical History:  Procedure Laterality Date  . MOUTH SURGERY      OB History    Gravida  3   Para  1   Term  1   Preterm      AB  2   Living  1     SAB  1   TAB  1   Ectopic  0   Multiple  0   Live Births  1            Home Medications    Prior to Admission medications   Medication Sig Start Date End Date Taking? Authorizing Provider  megestrol (MEGACE) 40 MG tablet Take 1 tablet (40 mg total) by mouth 2 (two) times daily. 04/03/18   Mihail Prettyman, Junius CreamerHallie C, PA-C    Family History Family History  Problem Relation Age of Onset  . Heart disease Mother   . Healthy Father     Social History Social History   Tobacco Use  . Smoking  status: Current Every Day Smoker    Packs/day: 0.50    Years: 4.00    Pack years: 2.00  . Smokeless tobacco: Never Used  Substance Use Topics  . Alcohol use: Yes  . Drug use: Yes    Types: Marijuana     Allergies   Patient has no known allergies.   Review of Systems Review of Systems  Constitutional: Negative for fever.  Respiratory: Negative for shortness of breath.   Cardiovascular: Negative for chest pain.  Gastrointestinal: Positive for nausea. Negative for abdominal pain, diarrhea and vomiting.  Genitourinary: Positive for menstrual problem and vaginal bleeding. Negative for dysuria, flank pain, genital sores, hematuria, vaginal discharge and vaginal pain.  Musculoskeletal: Negative for back pain.  Skin: Negative for rash.  Neurological: Negative for dizziness, light-headedness and headaches.     Physical Exam Triage Vital Signs ED Triage Vitals  Enc Vitals Group     BP 04/03/18 1430 117/77     Pulse Rate 04/03/18 1430 76     Resp 04/03/18 1430 16     Temp 04/03/18 1430 98.2 F (36.8 C)     Temp Source 04/03/18 1430 Oral  SpO2 04/03/18 1430 97 %     Weight --      Height --      Head Circumference --      Peak Flow --      Pain Score 04/03/18 1427 5     Pain Loc --      Pain Edu? --      Excl. in GC? --    No data found.  Updated Vital Signs BP 117/77 (BP Location: Left Arm)   Pulse 76   Temp 98.2 F (36.8 C) (Oral)   Resp 16   LMP 04/03/2018 (Exact Date)   SpO2 97%   Breastfeeding No   Visual Acuity Right Eye Distance:   Left Eye Distance:   Bilateral Distance:    Right Eye Near:   Left Eye Near:    Bilateral Near:     Physical Exam Vitals signs and nursing note reviewed.  Constitutional:      General: She is not in acute distress.    Appearance: She is well-developed.  HENT:     Head: Normocephalic and atraumatic.  Eyes:     Conjunctiva/sclera: Conjunctivae normal.  Neck:     Musculoskeletal: Neck supple.  Cardiovascular:      Rate and Rhythm: Normal rate and regular rhythm.     Heart sounds: No murmur.  Pulmonary:     Effort: Pulmonary effort is normal. No respiratory distress.     Breath sounds: Normal breath sounds.  Abdominal:     Palpations: Abdomen is soft.     Tenderness: There is abdominal tenderness.     Comments: Abdomen soft, nondistended, striae present across abdomen, mild generalized tenderness throughout abdomen, no focal tenderness, negative rebound, negative Rovsing, negative McBurney's.  Genitourinary:    Comments: Normal external female genitalia, no external sources of bleeding visualized, cervix pink, no cervical erythema, large red clots exiting os, mild discomfort with exam Skin:    General: Skin is warm and dry.  Neurological:     Mental Status: She is alert.      UC Treatments / Results  Labs (all labs ordered are listed, but only abnormal results are displayed) Labs Reviewed  HCG, QUANTITATIVE, PREGNANCY  I-STAT CHEM 8, ED  POCT I-STAT, CHEM 8  CERVICOVAGINAL ANCILLARY ONLY    EKG None  Radiology No results found.  Procedures Procedures (including critical care time)  Medications Ordered in UC Medications  cefTRIAXone (ROCEPHIN) injection 250 mg (250 mg Intramuscular Given 04/03/18 1519)  azithromycin (ZITHROMAX) tablet 1,000 mg (1,000 mg Oral Given 04/03/18 1519)    Initial Impression / Assessment and Plan / UC Course  I have reviewed the triage vital signs and the nursing notes.  Pertinent labs & imaging results that were available during my care of the patient were reviewed by me and considered in my medical decision making (see chart for details).     Pregnancy test negative, hemoglobin stable.  Will draw hCG quantitative to confirm bleeding not related to miscarriage given cycle 2 months late.  Treatment for gonorrhea and chlamydia given today given exposure.  Swab obtained to send off to confirm as well as check for other STDs.  Will provide Megace to take  twice daily until bleeding stops.  Follow-up with OB/GYN for persistent bleeding or recurrent bleeding.Discussed strict return precautions. Patient verbalized understanding and is agreeable with plan.  Final Clinical Impressions(s) / UC Diagnoses   Final diagnoses:  Exposure to STD  Vaginal bleeding     Discharge Instructions  We have treated you today for gonorrhea and chlamydia, with Rocephin and azithromycin. Please refrain from sexual intercourse for 7 days while medicines eliminating infection.   We are testing you for Gonorrhea, Chlamydia, Trichomonas, Yeast and Bacterial Vaginosis. We will call you if anything is positive and let you know if you require any further treatment. Please inform partners of any positive results.   Please return if symptoms not improving with treatment, development of fever, nausea, vomiting, abdominal pain.   Your hemoglobin is stable, it does not appear that you have lost too much blood Please begin taking Megace twice daily to help stop the bleeding; take until bleeding stops Follow-up with OB/GYN    ED Prescriptions    Medication Sig Dispense Auth. Provider   megestrol (MEGACE) 40 MG tablet Take 1 tablet (40 mg total) by mouth 2 (two) times daily. 20 tablet Stehanie Ekstrom, [place] C, PA-C     Controlled Substance Prescriptions [place] Controlled Substance Registry consulted? Not Applicable   Lew Dawes, [place] 04/03/18 1527

## 2018-04-03 NOTE — Discharge Instructions (Addendum)
We have treated you today for gonorrhea and chlamydia, with Rocephin and azithromycin. Please refrain from sexual intercourse for 7 days while medicines eliminating infection.   We are testing you for Gonorrhea, Chlamydia, Trichomonas, Yeast and Bacterial Vaginosis. We will call you if anything is positive and let you know if you require any further treatment. Please inform partners of any positive results.   Please return if symptoms not improving with treatment, development of fever, nausea, vomiting, abdominal pain.   Your hemoglobin is stable, it does not appear that you have lost too much blood Please begin taking Megace twice daily to help stop the bleeding; take until bleeding stops Follow-up with OB/GYN

## 2018-04-03 NOTE — ED Triage Notes (Signed)
[facility] a [facility] to [facility]. The patient reported that her partner told her he tested positive for gonorrhea. The patient denied any symptoms but did report unusual vaginal bleeding.

## 2018-04-04 LAB — POCT PREGNANCY, URINE: PREG TEST UR: NEGATIVE

## 2018-04-05 ENCOUNTER — Telehealth (HOSPITAL_COMMUNITY): Payer: Self-pay | Admitting: Emergency Medicine

## 2018-04-05 LAB — CERVICOVAGINAL ANCILLARY ONLY
BACTERIAL VAGINITIS: POSITIVE — AB
Candida vaginitis: NEGATIVE
Chlamydia: NEGATIVE
NEISSERIA GONORRHEA: POSITIVE — AB
TRICH (WINDOWPATH): POSITIVE — AB

## 2018-04-05 MED ORDER — METRONIDAZOLE 500 MG PO TABS: 500.0000 mg | ORAL_TABLET | Freq: Two times a day (BID) | ORAL | 0 refills | Status: DC

## 2018-04-05 NOTE — Telephone Encounter (Signed)
Bacterial vaginosis is positive. This was not treated at the urgent care visit.  Flagyl 500 mg BID x 7 days #14 no refills sent to patients pharmacy of choice.    Test for gonorrhea was positive. This was treated at the urgent care visit with IM rocephin 250mg  and po zithromax 1g. Pt needs education to refrain from sexual intercourse for 7 days after treatment to give the medicine time to work. Sexual partners need to be notified and tested/treated. Condoms may reduce risk of reinfection. Recheck or followup with PCP for further evaluation if symptoms are not improving. [facility] notified.   Trichomonas is positive., Flagyl  was sent to the pharmacy of record. Pt needs education to refrain from sexual intercourse for 7 days to give the medicine time to work. Sexual partners need to be notified and tested/treated. Condoms may reduce risk of reinfection. Recheck for further evaluation if symptoms are not improving.   Pt contacted and made aware. All questions answered.

## 2018-09-09 ENCOUNTER — Ambulatory Visit (HOSPITAL_COMMUNITY)
Admission: EM | Admit: 2018-09-09 | Discharge: 2018-09-09 | Disposition: A | Discharge status: Home / Self Care | Payer: Medicaid Other | Attending: Internal Medicine | Admitting: Internal Medicine

## 2018-09-09 ENCOUNTER — Encounter (HOSPITAL_COMMUNITY): Payer: Self-pay

## 2018-09-09 ENCOUNTER — Other Ambulatory Visit: Payer: Self-pay

## 2018-09-09 DIAGNOSIS — L0291 Cutaneous abscess, unspecified: Secondary | ICD-10-CM

## 2018-09-09 MED ORDER — CEPHALEXIN 500 MG PO CAPS: 500.0000 mg | ORAL_CAPSULE | Freq: Three times a day (TID) | ORAL | 0 refills | Status: AC

## 2018-09-09 MED ORDER — LIDOCAINE HCL (PF) 2 % IJ SOLN
INTRAMUSCULAR | Status: AC
  Filled 2018-09-09: qty 2

## 2018-09-09 NOTE — ED Provider Notes (Addendum)
[facility]    CSN: [id number] Arrival date & time: 09/09/18  1808     History   Chief Complaint Chief Complaint  Patient presents with  . Abscess    HPI Emma Wilcox is a 23 y.o. female presents to the urgent care with an abscess over the left proximal forearm.  Symptoms started a few days ago and is gotten progressively worse.  No fever or chills.  Pain is severe.   Pain is throbbing.  No relieving factors.  She has not tried any over-the-counter medications.  HPI  Past Medical History:  Diagnosis Date  . Medical history non-contributory     Patient Active Problem List   Diagnosis Date Noted  . SVD (spontaneous vaginal delivery) 04/13/2017  . Encounter for induction of labor 04/11/2017  . Supervision of other normal pregnancy, antepartum 11/17/2016    Past Surgical History:  Procedure Laterality Date  . MOUTH SURGERY      OB History    Gravida  3   Para  1   Term  1   Preterm      AB  2   Living  1     SAB  1   TAB  1   Ectopic  0   Multiple  0   Live Births  1            Home Medications    Prior to Admission medications   Medication Sig Start Date End Date Taking? Authorizing Provider  cephALEXin (KEFLEX) 500 MG capsule Take 1 capsule (500 mg total) by mouth 3 (three) times daily for 5 days. 09/09/18 09/14/18  Merrilee Jansky, MD  megestrol (MEGACE) 40 MG tablet Take 1 tablet (40 mg total) by mouth 2 (two) times daily. 04/03/18   Wieters, Junius Creamer, PA-C    Family History Family History  Problem Relation Age of Onset  . Heart disease Mother   . Healthy Father     Social History Social History   Tobacco Use  . Smoking status: Current Every Day Smoker    Packs/day: 0.50    Years: 4.00    Pack years: 2.00  . Smokeless tobacco: Never Used  Substance Use Topics  . Alcohol use: Yes  . Drug use: Yes    Types: Marijuana     Allergies   Patient has no known allergies.   Review of Systems Review of Systems   Constitutional: Negative.   HENT: Negative.   Respiratory: Negative.   Cardiovascular: Negative.   Gastrointestinal: Negative.   Musculoskeletal: Positive for arthralgias. Negative for joint swelling and myalgias.  Skin: Positive for color change, rash and wound.  Neurological: Negative.      Physical Exam Triage Vital Signs ED Triage Vitals  Enc Vitals Group     BP 09/09/18 1832 106/69     Pulse Rate 09/09/18 1832 97     Resp 09/09/18 1832 18     Temp 09/09/18 1832 98.2 F (36.8 C)     Temp Source 09/09/18 1832 Oral     SpO2 09/09/18 1832 98 %     Weight --      Height --      Head Circumference --      Peak Flow --      Pain Score 09/09/18 1833 8     Pain Loc --      Pain Edu? --      Excl. in GC? --    No data found.  Updated Vital Signs BP 106/69 (BP Location: Right Arm)   Pulse 97   Temp 98.2 F (36.8 C) (Oral)   Resp 18   LMP 09/07/2018   SpO2 98%   Visual Acuity Right Eye Distance:   Left Eye Distance:   Bilateral Distance:    Right Eye Near:   Left Eye Near:    Bilateral Near:     Physical Exam Constitutional:      General: She is in acute distress.     Appearance: Normal appearance. She is not ill-appearing.  Pulmonary:     Effort: Pulmonary effort is normal.     Breath sounds: Normal breath sounds.  Abdominal:     General: Bowel sounds are normal.     Palpations: Abdomen is soft.  Skin:    Capillary Refill: Capillary refill takes less than 2 seconds.     Findings: No erythema.     Comments: Abscess over the posterior surface of proximal left forearm with surrounding erythema.  Neurological:     General: No focal deficit present.     Mental Status: She is alert.      UC Treatments / Results  Labs (all labs ordered are listed, but only abnormal results are displayed) Labs Reviewed - No data to display  EKG None  Radiology No results found.  Procedures Incision and Drainage Date/Time: 09/09/2018 7:42 PM Performed by:  Merrilee Jansky, MD Authorized by: Merrilee Jansky, MD   Consent:    Consent obtained:  Verbal   Consent given by:  Patient   Risks discussed:  Bleeding, incomplete drainage, pain and infection   Alternatives discussed:  No treatment Location:    Type:  Abscess   Location:  Upper extremity   Upper extremity location:  Arm   Arm location:  L lower arm Pre-procedure details:    Skin preparation:  Betadine Anesthesia (see MAR for exact dosages):    Anesthesia method:  Local infiltration   Local anesthetic:  Lidocaine 1% w/o epi Procedure type:    Complexity:  Simple Procedure details:    Incision types:  Stab incision   Incision depth:  Subcutaneous   Scalpel blade:  11   Wound management:  Extensive cleaning   Drainage:  Purulent   Drainage amount:  Moderate   Wound treatment:  Wound left open   Packing materials:  None Post-procedure details:    Patient tolerance of procedure:  Tolerated well, no immediate complications   (including critical care time)  Medications Ordered in UC Medications - No data to display  Initial Impression / Assessment and Plan / UC Course  I have reviewed the triage vital signs and the nursing notes.  Pertinent labs & imaging results that were available during my care of the patient were reviewed by me and considered in my medical decision making (see chart for details).     1.  Abscess over the left forearm: Incision and drainage was done.  Clean dressing was applied I&D procedure discharge instructions were given. Final Clinical Impressions(s) / UC Diagnoses   Final diagnoses:  Abscess   Discharge Instructions   None    ED Prescriptions    Medication Sig Dispense Auth. Provider   cephALEXin (KEFLEX) 500 MG capsule Take 1 capsule (500 mg total) by mouth 3 (three) times daily for 5 days. 15 capsule Domenic Schoenberger, Britta Mccreedy, MD     Controlled Substance Prescriptions [place] Controlled Substance Registry consulted? No   Merrilee Jansky,  MD 09/09/18 [phone]  Merrilee JanskyLamptey, Danita Proud O, MD 09/09/18 1943    Merrilee JanskyLamptey, Mikaiya Tramble O, MD 09/09/18 [phone]

## 2018-09-09 NOTE — ED Triage Notes (Signed)
Pt presents with abscess on left elbow.

## 2018-11-15 ENCOUNTER — Ambulatory Visit (HOSPITAL_COMMUNITY)
Admission: EM | Admit: 2018-11-15 | Discharge: 2018-11-15 | Disposition: A | Discharge status: Home / Self Care | Payer: Medicaid Other | Attending: Family Medicine | Admitting: Family Medicine

## 2018-11-15 ENCOUNTER — Other Ambulatory Visit: Payer: Self-pay

## 2018-11-15 ENCOUNTER — Encounter (HOSPITAL_COMMUNITY): Payer: Self-pay

## 2018-11-15 DIAGNOSIS — Z7251 High risk heterosexual behavior: Secondary | ICD-10-CM | POA: Insufficient documentation

## 2018-11-15 DIAGNOSIS — Z3202 Encounter for pregnancy test, result negative: Secondary | ICD-10-CM | POA: Diagnosis not present

## 2018-11-15 DIAGNOSIS — Z202 Contact with and (suspected) exposure to infections with a predominantly sexual mode of transmission: Secondary | ICD-10-CM | POA: Diagnosis not present

## 2018-11-15 LAB — POCT URINALYSIS DIP (DEVICE)
Bilirubin Urine: NEGATIVE
Glucose, UA: NEGATIVE mg/dL
Leukocytes,Ua: NEGATIVE
Nitrite: NEGATIVE
Protein, ur: NEGATIVE mg/dL
Specific Gravity, Urine: >=1.03 (ref 1.005–1.030)
Urobilinogen, UA: 0.2 mg/dL (ref 0.0–1.0)
pH: 5.5 (ref 5.0–8.0)

## 2018-11-15 LAB — POCT PREGNANCY, URINE: Preg Test, Ur: NEGATIVE

## 2018-11-15 NOTE — ED Provider Notes (Addendum)
[place]    CSN: [id number] Arrival date & time: 11/15/18  1712     History   Chief Complaint Chief Complaint  Patient presents with  . PMS issue    HPI Emma Wilcox is a 23 y.o. female.   Patient presents with irregular menses x2 years.  She states she has "dried blood" that she passes intermittently.  She states her last menstrual cycle was in May 2020.  She reports small amount of clear white vaginal discharge.  She denies lesions, abdominal pain, dysuria, back pain, pelvic pain, pain with intercourse.  She states she is sexually active with the same partner x4 months; does not use condoms.    The history is provided by the patient.    Past Medical History:  Diagnosis Date  . Medical history non-contributory     Patient Active Problem List   Diagnosis Date Noted  . SVD (spontaneous vaginal delivery) 04/13/2017  . Encounter for induction of labor 04/11/2017  . Supervision of other normal pregnancy, antepartum 11/17/2016    Past Surgical History:  Procedure Laterality Date  . MOUTH SURGERY      OB History    Gravida  3   Para  1   Term  1   Preterm      AB  2   Living  1     SAB  1   TAB  1   Ectopic  0   Multiple  0   Live Births  1            Home Medications    Prior to Admission medications   Medication Sig Start Date End Date Taking? Authorizing Provider  megestrol (MEGACE) 40 MG tablet Take 1 tablet (40 mg total) by mouth 2 (two) times daily. 04/03/18   Wieters, Elesa Hacker, PA-C    Family History Family History  Problem Relation Age of Onset  . Heart disease Mother   . Healthy Father     Social History Social History   Tobacco Use  . Smoking status: Current Every Day Smoker    Packs/day: 0.50    Years: 4.00    Pack years: 2.00  . Smokeless tobacco: Never Used  Substance Use Topics  . Alcohol use: Yes  . Drug use: Yes    Types: Marijuana     Allergies   Patient has no known allergies.   Review of  Systems Review of Systems  Constitutional: Negative for chills and fever.  HENT: Negative for ear pain and sore throat.   Eyes: Negative for pain and visual disturbance.  Respiratory: Negative for cough and shortness of breath.   Cardiovascular: Negative for chest pain and palpitations.  Gastrointestinal: Negative for abdominal pain and vomiting.  Genitourinary: Positive for vaginal discharge. Negative for dysuria, flank pain, hematuria and pelvic pain.  Musculoskeletal: Negative for arthralgias and back pain.  Skin: Negative for color change and rash.  Neurological: Negative for seizures and syncope.  All other systems reviewed and are negative.    Physical Exam Triage Vital Signs ED Triage Vitals  Enc Vitals Group     BP 11/15/18 1730 119/62     Pulse Rate 11/15/18 1730 86     Resp 11/15/18 1730 16     Temp 11/15/18 1730 97.9 F (36.6 C)     Temp Source 11/15/18 1730 Temporal     SpO2 11/15/18 1730 99 %     Weight 11/15/18 1755 145 lb (65.8 kg)  Height --      Head Circumference --      Peak Flow --      Pain Score 11/15/18 1755 0     Pain Loc --      Pain Edu? --      Excl. in GC? --    No data found.  Updated Vital Signs BP 119/62 (BP Location: Left Arm)   Pulse 86   Temp 97.9 F (36.6 C) (Temporal)   Resp 16   Wt 145 lb (65.8 kg)   LMP 08/16/2018   SpO2 99%   BMI 25.69 kg/m   Visual Acuity Right Eye Distance:   Left Eye Distance:   Bilateral Distance:    Right Eye Near:   Left Eye Near:    Bilateral Near:     Physical Exam Vitals signs and nursing note reviewed.  Constitutional:      General: She is not in acute distress.    Appearance: She is well-developed.  HENT:     Head: Normocephalic and atraumatic.  Eyes:     Conjunctiva/sclera: Conjunctivae normal.  Neck:     Musculoskeletal: Neck supple.  Cardiovascular:     Rate and Rhythm: Normal rate and regular rhythm.     Heart sounds: No murmur.  Pulmonary:     Effort: Pulmonary effort  is normal. No respiratory distress.     Breath sounds: Normal breath sounds.  Abdominal:     Palpations: Abdomen is soft.     Tenderness: There is no abdominal tenderness. There is no right CVA tenderness, left CVA tenderness, guarding or rebound.  Genitourinary:    Pubic Area: No rash.      Labia:        Right: No rash, tenderness or lesion.        Left: No rash, tenderness or lesion.      Vagina: Vaginal discharge present. No tenderness or lesions.     Cervix: Normal.     Uterus: Normal.      Adnexa: Right adnexa normal and left adnexa normal.     Comments: Scant amount of brown blood and white discharge in vagina. Skin:    General: Skin is warm and dry.  Neurological:     Mental Status: She is alert.      UC Treatments / Results  Labs (all labs ordered are listed, but only abnormal results are displayed) Labs Reviewed  POCT URINALYSIS DIP (DEVICE) - Abnormal; Notable for the following components:      Result Value   Ketones, ur TRACE (*)    Hgb urine dipstick MODERATE (*)    All other components within normal limits  POC URINE PREG, ED  POCT PREGNANCY, URINE  CERVICOVAGINAL ANCILLARY ONLY    EKG   Radiology No results found.  Procedures Procedures (including critical care time)  Medications Ordered in UC Medications - No data to display  Initial Impression / Assessment and Plan / UC Course  I have reviewed the triage vital signs and the nursing notes.  Pertinent labs & imaging results that were available during my care of the patient were reviewed by me and considered in my medical decision making (see chart for details).   Unprotected sex and possible exposure to STD.  Discussed with patient that we would call her with her STD results and that she and her partner would need to be treated if they were positive.  Discussed that she should abstain from sex until her test results are back.  Patient declined proactive treatment for STDs today.  Instructed patient  to follow-up with a GYN to discuss her irregular periods.   Patient left prior to receiving written instructions.     Final Clinical Impressions(s) / UC Diagnoses   Final diagnoses:  Unprotected sex  Possible exposure to STD     Discharge Instructions     Your urine pregnancy test was negative.   Your STD tests are pending.  Do not have sex until your test results are back.  If your test results come back positive, you and your sexual partner will need to be treated.  We will call you if your tests come back positive.    Follow-up with the GYN listed to discuss your irregular periods.        ED Prescriptions    None     Controlled Substance Prescriptions [place] Controlled Substance Registry consulted? Not Applicable   Mickie Bailate, Lovis More H, NP 11/15/18 1840    Mickie Bailate, Pattye Meda H, NP 11/15/18 [phone]

## 2018-11-15 NOTE — Discharge Instructions (Addendum)
Your urine pregnancy test was negative.   Your STD tests are pending.  Do not have sex until your test results are back.  If your test results come back positive, you and your sexual partner will need to be treated.  We will call you if your tests come back positive.    Follow-up with the GYN listed to discuss your irregular periods.

## 2018-11-15 NOTE — ED Triage Notes (Signed)
Pt states she has been having something she calls "dry blood"  that comes and goes. This has been going on for a month.

## 2018-11-16 LAB — CERVICOVAGINAL ANCILLARY ONLY
Bacterial vaginitis: POSITIVE — AB
Candida vaginitis: NEGATIVE
Trichomonas: POSITIVE — AB

## 2018-11-17 ENCOUNTER — Telehealth (HOSPITAL_COMMUNITY): Payer: Self-pay | Admitting: Emergency Medicine

## 2018-11-17 MED ORDER — METRONIDAZOLE 500 MG PO TABS: 500.0000 mg | ORAL_TABLET | Freq: Two times a day (BID) | ORAL | 0 refills | Status: AC

## 2018-11-17 NOTE — Telephone Encounter (Signed)
Pt returned call about swab results, all questions answered.

## 2018-11-17 NOTE — Telephone Encounter (Signed)
Bacterial vaginosis is positive. This was not treated at the urgent care visit.  Flagyl 500 mg BID x 7 days #14 no refills sent to patients pharmacy of choice.    Trichomonas is positive. Rx  for Flagyl 2 grams, once was sent to the pharmacy of record. Pt needs education to refrain from sexual intercourse for 7 days to give the medicine time to work. Sexual partners need to be notified and tested/treated. Condoms may reduce risk of reinfection. Recheck for further evaluation if symptoms are not improving.   Attempted to reach patient. No answer at this time. Call cannot be completed.

## 2019-01-20 ENCOUNTER — Emergency Department (HOSPITAL_COMMUNITY): Payer: No Typology Code available for payment source

## 2019-01-20 ENCOUNTER — Emergency Department (HOSPITAL_COMMUNITY)
Admission: EM | Admit: 2019-01-20 | Discharge: 2019-01-20 | Disposition: A | Discharge status: Home / Self Care | Payer: No Typology Code available for payment source | Attending: Emergency Medicine | Admitting: Emergency Medicine

## 2019-01-20 DIAGNOSIS — Y929 Unspecified place or not applicable: Secondary | ICD-10-CM | POA: Insufficient documentation

## 2019-01-20 DIAGNOSIS — S20211A Contusion of right front wall of thorax, initial encounter: Secondary | ICD-10-CM | POA: Diagnosis not present

## 2019-01-20 DIAGNOSIS — Z23 Encounter for immunization: Secondary | ICD-10-CM | POA: Insufficient documentation

## 2019-01-20 DIAGNOSIS — T07XXXA Unspecified multiple injuries, initial encounter: Secondary | ICD-10-CM | POA: Diagnosis present

## 2019-01-20 DIAGNOSIS — M79641 Pain in right hand: Secondary | ICD-10-CM | POA: Diagnosis not present

## 2019-01-20 DIAGNOSIS — S301XXA Contusion of abdominal wall, initial encounter: Secondary | ICD-10-CM | POA: Insufficient documentation

## 2019-01-20 DIAGNOSIS — R519 Headache, unspecified: Secondary | ICD-10-CM | POA: Insufficient documentation

## 2019-01-20 DIAGNOSIS — Y939 Activity, unspecified: Secondary | ICD-10-CM | POA: Diagnosis not present

## 2019-01-20 DIAGNOSIS — Y999 Unspecified external cause status: Secondary | ICD-10-CM | POA: Diagnosis not present

## 2019-01-20 DIAGNOSIS — M542 Cervicalgia: Secondary | ICD-10-CM | POA: Diagnosis not present

## 2019-01-20 LAB — URINALYSIS, ROUTINE W REFLEX MICROSCOPIC
Bilirubin Urine: NEGATIVE
Glucose, UA: NEGATIVE mg/dL
Ketones, ur: NEGATIVE mg/dL
Leukocytes,Ua: NEGATIVE
Nitrite: NEGATIVE
Protein, ur: 30 mg/dL — AB
Specific Gravity, Urine: 1.025 (ref 1.005–1.030)
pH: 6 (ref 5.0–8.0)

## 2019-01-20 LAB — CBC
HCT: 46 % (ref 36.0–46.0)
Hemoglobin: 15.1 g/dL — ABNORMAL HIGH (ref 12.0–15.0)
MCH: 31.5 pg (ref 26.0–34.0)
MCHC: 32.8 g/dL (ref 30.0–36.0)
MCV: 96 fL (ref 80.0–100.0)
Platelets: 251 10*3/uL (ref 150–400)
RBC: 4.79 MIL/uL (ref 3.87–5.11)
RDW: 13.8 % (ref 11.5–15.5)
WBC: 8.9 10*3/uL (ref 4.0–10.5)
nRBC: 0 % (ref 0.0–0.2)

## 2019-01-20 LAB — COMPREHENSIVE METABOLIC PANEL
ALT: 23 U/L (ref 0–44)
AST: 29 U/L (ref 15–41)
Albumin: 4.4 g/dL (ref 3.5–5.0)
Alkaline Phosphatase: 59 U/L (ref 38–126)
Anion gap: 8 (ref 5–15)
BUN: 16 mg/dL (ref 6–20)
CO2: 23 mmol/L (ref 22–32)
Calcium: 9.5 mg/dL (ref 8.9–10.3)
Chloride: 109 mmol/L (ref 98–111)
Creatinine, Ser: 0.95 mg/dL (ref 0.44–1.00)
GFR calc Af Amer: >60 mL/min (ref >60)
GFR calc non Af Amer: >60 mL/min (ref >60)
Glucose, Bld: 99 mg/dL (ref 70–99)
Potassium: 4.1 mmol/L (ref 3.5–5.1)
Sodium: 140 mmol/L (ref 135–145)
Total Bilirubin: 0.9 mg/dL (ref 0.3–1.2)
Total Protein: 7.2 g/dL (ref 6.5–8.1)

## 2019-01-20 LAB — I-STAT BETA HCG BLOOD, ED (MC, WL, AP ONLY): I-stat hCG, quantitative: <5 m[IU]/mL (ref <5)

## 2019-01-20 LAB — I-STAT CHEM 8, ED
BUN: 21 mg/dL — ABNORMAL HIGH (ref 6–20)
Calcium, Ion: 1.15 mmol/L (ref 1.15–1.40)
Chloride: 109 mmol/L (ref 98–111)
Creatinine, Ser: 0.8 mg/dL (ref 0.44–1.00)
Glucose, Bld: 92 mg/dL (ref 70–99)
HCT: 46 % (ref 36.0–46.0)
Hemoglobin: 15.6 g/dL — ABNORMAL HIGH (ref 12.0–15.0)
Potassium: 4.1 mmol/L (ref 3.5–5.1)
Sodium: 141 mmol/L (ref 135–145)
TCO2: 23 mmol/L (ref 22–32)

## 2019-01-20 LAB — LACTIC ACID, PLASMA: Lactic Acid, Venous: 0.9 mmol/L (ref 0.5–1.9)

## 2019-01-20 MED ORDER — SODIUM CHLORIDE 0.9 % IV BOLUS
1000.0000 mL | Freq: Once | INTRAVENOUS | Status: AC
  Administered 2019-01-20: 1000 mL via INTRAVENOUS

## 2019-01-20 MED ORDER — METHOCARBAMOL 500 MG PO TABS: 500.0000 mg | ORAL_TABLET | Freq: Two times a day (BID) | ORAL | 0 refills | Status: DC

## 2019-01-20 MED ORDER — IOHEXOL 300 MG/ML  SOLN
100.0000 mL | Freq: Once | INTRAMUSCULAR | Status: AC | PRN
  Administered 2019-01-20: 12:00:00 100 mL via INTRAVENOUS

## 2019-01-20 MED ORDER — TETANUS-DIPHTH-ACELL PERTUSSIS 5-2.5-18.5 LF-MCG/0.5 IM SUSP
0.5000 mL | Freq: Once | INTRAMUSCULAR | Status: AC
  Administered 2019-01-20: 12:00:00 0.5 mL via INTRAMUSCULAR
  Filled 2019-01-20: qty 0.5

## 2019-01-20 MED ORDER — MORPHINE SULFATE (PF) 4 MG/ML IV SOLN
4.0000 mg | Freq: Once | INTRAVENOUS | Status: AC
  Administered 2019-01-20: 4 mg via INTRAVENOUS
  Filled 2019-01-20: qty 1

## 2019-01-20 MED ORDER — NAPROXEN 500 MG PO TABS: 500.0000 mg | ORAL_TABLET | Freq: Two times a day (BID) | ORAL | 0 refills | Status: DC
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
## 2019-01-20 NOTE — ED Notes (Signed)
Patient transported to X-ray 

## 2019-01-20 NOTE — ED Notes (Addendum)
Discharge instructions and prescriptions discussed with Pt. Pt verbalized understanding. Pt stable and ambulatory.  Pt leaving with boyfriend.
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
## 2019-04-03 ENCOUNTER — Other Ambulatory Visit: Payer: Self-pay

## 2019-04-03 ENCOUNTER — Encounter (HOSPITAL_COMMUNITY): Payer: Self-pay

## 2019-04-03 ENCOUNTER — Ambulatory Visit (HOSPITAL_COMMUNITY)
Admission: EM | Admit: 2019-04-03 | Discharge: 2019-04-03 | Disposition: A | Discharge status: Home / Self Care | Payer: Medicaid Other | Attending: Physician Assistant | Admitting: Physician Assistant

## 2019-04-03 DIAGNOSIS — Z711 Person with feared health complaint in whom no diagnosis is made: Secondary | ICD-10-CM | POA: Insufficient documentation

## 2019-04-03 DIAGNOSIS — B9689 Other specified bacterial agents as the cause of diseases classified elsewhere: Secondary | ICD-10-CM | POA: Diagnosis present

## 2019-04-03 DIAGNOSIS — Z202 Contact with and (suspected) exposure to infections with a predominantly sexual mode of transmission: Secondary | ICD-10-CM

## 2019-04-03 DIAGNOSIS — N760 Acute vaginitis: Secondary | ICD-10-CM | POA: Diagnosis not present

## 2019-04-03 DIAGNOSIS — Z113 Encounter for screening for infections with a predominantly sexual mode of transmission: Secondary | ICD-10-CM

## 2019-04-03 MED ORDER — METRONIDAZOLE 500 MG PO TABS: 500.0000 mg | ORAL_TABLET | Freq: Two times a day (BID) | ORAL | 0 refills | Status: DC

## 2019-04-03 NOTE — ED Provider Notes (Signed)
[place]    CSN: [id number] Arrival date & time: 04/03/19  1559      History   Chief Complaint Chief Complaint  Patient presents with  . Vaginal Discharge    HPI Emma Wilcox is a 23 y.o. female.   Patient reports to urgent care today for 3 days of increased vaginal discharge. Patient notes following last period which was last week, she has had increasing white liquid discharge with increased odor. She denies known exposure to STI. She denies vaginal itching, burning or pelvic pain. She has one female sexual partner. They do not always utilize condoms.   She denies fever, chills, abdominal pain.   She would like to be tested for "STD's if it looks like it".       Past Medical History:  Diagnosis Date  . Medical history non-contributory     Patient Active Problem List   Diagnosis Date Noted  . SVD (spontaneous vaginal delivery) 04/13/2017  . Encounter for induction of labor 04/11/2017  . Supervision of other normal pregnancy, antepartum 11/17/2016    Past Surgical History:  Procedure Laterality Date  . MOUTH SURGERY      OB History    Gravida  3   Para  1   Term  1   Preterm      AB  2   Living  1     SAB  1   TAB  1   Ectopic  0   Multiple  0   Live Births  1            Home Medications    Prior to Admission medications   Medication Sig Start Date End Date Taking? Authorizing Provider  methocarbamol (ROBAXIN) 500 MG tablet Take 1 tablet (500 mg total) by mouth 2 (two) times daily. 01/20/19   Cheek, Comer Locket, PA-C  metroNIDAZOLE (FLAGYL) 500 MG tablet Take 1 tablet (500 mg total) by mouth 2 (two) times daily. 04/03/19   Paolina Karwowski, Marguerita Beards, PA-C  naproxen (NAPROSYN) 500 MG tablet Take 1 tablet (500 mg total) by mouth 2 (two) times daily. 01/20/19   Jonette Eva, PA-C    Family History Family History  Problem Relation Age of Onset  . Heart disease Mother   . Healthy Father     Social History Social History   Tobacco  Use  . Smoking status: Current Every Day Smoker    Packs/day: 0.50    Years: 4.00    Pack years: 2.00  . Smokeless tobacco: Never Used  Substance Use Topics  . Alcohol use: Yes  . Drug use: Yes    Types: Marijuana     Allergies   Patient has no known allergies.   Review of Systems Review of Systems  Constitutional: Negative for chills, fatigue and fever.  HENT: Negative for sore throat.   Eyes: Negative for visual disturbance.  Respiratory: Negative for cough and shortness of breath.   Cardiovascular: Negative for chest pain.  Gastrointestinal: Negative for abdominal pain, diarrhea, nausea and vomiting.  Genitourinary: Positive for vaginal discharge. Negative for difficulty urinating, dyspareunia, dysuria, flank pain, genital sores, menstrual problem, pelvic pain, urgency, vaginal bleeding and vaginal pain.  Skin: Negative for color change and rash.  Neurological: Negative for headaches.  Hematological: Negative for adenopathy.  All other systems reviewed and are negative.    Physical Exam Triage Vital Signs ED Triage Vitals  Enc Vitals Group     BP 04/03/19 1700 (!) 111/57  Pulse Rate 04/03/19 1700 65     Resp 04/03/19 1700 16     Temp 04/03/19 1700 98.5 F (36.9 C)     Temp Source 04/03/19 1700 Oral     SpO2 04/03/19 1700 100 %     Weight --      Height --      Head Circumference --      Peak Flow --      Pain Score 04/03/19 1702 0     Pain Loc --      Pain Edu? --      Excl. in GC? --    No data found.  Updated Vital Signs BP (!) 111/57 (BP Location: Right Arm)   Pulse 65   Temp 98.5 F (36.9 C) (Oral)   Resp 16   LMP 03/21/2019   SpO2 100%   Visual Acuity Right Eye Distance:   Left Eye Distance:   Bilateral Distance:    Right Eye Near:   Left Eye Near:    Bilateral Near:     Physical Exam Vitals and nursing note reviewed.  Constitutional:      General: She is not in acute distress.    Appearance: Normal appearance. She is  well-developed and normal weight.  HENT:     Head: Normocephalic and atraumatic.  Eyes:     Conjunctiva/sclera: Conjunctivae normal.     Pupils: Pupils are equal, round, and reactive to light.  Cardiovascular:     Rate and Rhythm: Normal rate and regular rhythm.     Pulses: Normal pulses.     Heart sounds: Normal heart sounds. No murmur.  Pulmonary:     Effort: Pulmonary effort is normal. No respiratory distress.     Breath sounds: Normal breath sounds.  Abdominal:     General: Abdomen is flat. Bowel sounds are normal. There is no distension.     Palpations: Abdomen is soft. There is no mass.     Tenderness: There is no abdominal tenderness.  Genitourinary:    General: Normal vulva.     Vagina: Vaginal discharge present.     Comments: Moderate white liquid discharge in vaginal vault. Exam limited by patient discomfort and bi-manual exam deferred due to clinical value. Musculoskeletal:     Cervical back: Neck supple.     Right lower leg: No edema.     Left lower leg: No edema.  Lymphadenopathy:     Cervical: No cervical adenopathy.  Skin:    General: Skin is warm and dry.     Coloration: Skin is not jaundiced.     Findings: No rash.  Neurological:     General: No focal deficit present.     Mental Status: She is alert and oriented to person, place, and time.  Psychiatric:        Mood and Affect: Mood normal.        Behavior: Behavior normal.        Thought Content: Thought content normal.        Judgment: Judgment normal.      UC Treatments / Results  Labs (all labs ordered are listed, but only abnormal results are displayed) Labs Reviewed  CERVICOVAGINAL ANCILLARY ONLY    EKG   Radiology No results found.  Procedures Procedures (including critical care time)  Medications Ordered in UC Medications - No data to display  Initial Impression / Assessment and Plan / UC Course  I have reviewed the triage vital signs and the nursing notes.  Pertinent labs &  imaging results that were available during my care of the patient were reviewed by me and considered in my medical decision making (see chart for details).     Bacterial Vaginosis - Clinical Exam and history consistent with BV as compared to STI. Sent GC/CT/Trich due to patient desire. Treating with metronidazole 500mg  BIDx 7 days. Safe sex precautions given.   Final Clinical Impressions(s) / UC Diagnoses   Final diagnoses:  BV (bacterial vaginosis)  Concern about STD in female without diagnosis     Discharge Instructions     Please take the medication as prescribed. Do not drink alcohol while taking this medication.  We have sent lab testing and you will be notified by telephone of positive results. You may also check mychart to see you results. If we need to change your treatment we will contact you  Practice safe sex with condoms or other barrier methods at all times.       ED Prescriptions    Medication Sig Dispense Auth. Provider   metroNIDAZOLE (FLAGYL) 500 MG tablet Take 1 tablet (500 mg total) by mouth 2 (two) times daily. 14 tablet Jaber Dunlow, , PA-C     PDMP not reviewed this encounter.   Veryl Speak, PA-C 04/03/19 04/05/19

## 2019-04-03 NOTE — ED Triage Notes (Signed)
Pt present vaginal discharge, symptoms started 3 days ago. Pt denies any other symptom

## 2019-04-03 NOTE — Discharge Instructions (Addendum)
Please take the medication as prescribed. Do not drink alcohol while taking this medication.  We have sent lab testing and you will be notified by telephone of positive results. You may also check mychart to see you results. If we need to change your treatment we will contact you  Practice safe sex with condoms or other barrier methods at all times.

## 2019-04-04 LAB — CERVICOVAGINAL ANCILLARY ONLY
Chlamydia: NEGATIVE
Neisseria Gonorrhea: NEGATIVE
Trichomonas: NEGATIVE

## 2019-07-18 ENCOUNTER — Encounter: Payer: Medicaid Other | Admitting: Obstetrics

## 2019-07-20 ENCOUNTER — Ambulatory Visit: Payer: Medicaid Other | Admitting: Obstetrics
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
## 2021-03-25 ENCOUNTER — Encounter (HOSPITAL_COMMUNITY): Payer: Self-pay | Admitting: Emergency Medicine

## 2021-03-25 ENCOUNTER — Other Ambulatory Visit: Payer: Self-pay

## 2021-03-25 ENCOUNTER — Ambulatory Visit (HOSPITAL_COMMUNITY)
Admission: EM | Admit: 2021-03-25 | Discharge: 2021-03-25 | Disposition: A | Discharge status: Home / Self Care | Payer: Medicaid Other | Attending: Emergency Medicine | Admitting: Emergency Medicine

## 2021-03-25 DIAGNOSIS — N898 Other specified noninflammatory disorders of vagina: Secondary | ICD-10-CM | POA: Diagnosis present

## 2021-03-25 LAB — POCT URINALYSIS DIPSTICK, ED / UC
Glucose, UA: NEGATIVE mg/dL
Hgb urine dipstick: NEGATIVE
Nitrite: NEGATIVE
Protein, ur: 30 mg/dL — AB
Specific Gravity, Urine: >=1.03 (ref 1.005–1.030)
Urobilinogen, UA: 1 mg/dL (ref 0.0–1.0)
pH: 5.5 (ref 5.0–8.0)

## 2021-03-25 LAB — POC URINE PREG, ED: Preg Test, Ur: NEGATIVE

## 2021-03-25 MED ORDER — METRONIDAZOLE 500 MG PO TABS: 500.0000 mg | ORAL_TABLET | Freq: Two times a day (BID) | ORAL | 0 refills | Status: DC

## 2021-03-25 NOTE — ED Triage Notes (Signed)
Pt is present today with vaginal discharge. Pt sx started x2 days ago.   Pt states that she also noticed nipple discharged. Pt states she just got her nipples pierced and wanted to make sure there is no infection

## 2021-03-25 NOTE — Discharge Instructions (Addendum)
Your pregnancy test was negative  Your urinalysis was negative but did show signs of dehydration, please increase your water intake  Today you are being treated prophylactically for  Bacterial vaginosis   Take Metronidazole 500 mg twice a day for 7 days, do not drink alcohol while using medication, this will make you feel sick   Bacterial vaginosis which results from an overgrowth of one on several organisms that are normally present in your vagina. Vaginosis is an inflammation of the vagina that can result in discharge, itching and pain.  Labs pending 2-3 days, you will be contacted if positive for any sti and treatment will be sent to the pharmacy, you will have to return to the clinic if positive for gonorrhea to receive treatment   Please refrain from having sex until labs results, if positive please refrain from having sex until treatment complete and symptoms resolve   If positive for HIV, Syphilis, Chlamydia  gonorrhea or trichomoniasis please notify partner or partners so they may tested as well  Moving forward, it is recommended you use some form of protection against the transmission of sti infections  such as condoms or dental dams with each sexual encounter     In addition: Avoid baths, hot tubs and whirlpool spas.  Don't use scented or harsh soaps Avoid irritants. These include scented tampons and pads. Wipe from front to back after using the toilet. Don't douche. Your vagina doesn't require cleansing other than normal bathing.  Use a condom.  Wear cotton underwear, this fabric absorbs some moisture.

## 2021-03-25 NOTE — ED Provider Notes (Signed)
[facility]    CSN: [id number] Arrival date & time: 03/25/21  1258      History   Chief Complaint Chief Complaint  Patient presents with   Vaginal Discharge    HPI Emma Wilcox is a 25 y.o. female.   Patient presents with Emma Wilcox thick discharge with odor for 2 days. Endorses breast tenderness. Sexually  active, 2 partners, sometimes condom use. LMP March 01, 2021.  Denies abdominal pain, flank pain, urinary urgency or frequency, vaginal itching or irritation, dysuria, hematuria, fever, chills.  Has not attempted treatment of symptoms  Past Medical History:  Diagnosis Date   Medical history non-contributory     Patient Active Problem List   Diagnosis Date Noted   SVD (spontaneous vaginal delivery) 04/13/2017   Encounter for induction of labor 04/11/2017   Supervision of other normal pregnancy, antepartum 11/17/2016    Past Surgical History:  Procedure Laterality Date   MOUTH SURGERY      OB History     Gravida  3   Para  1   Term  1   Preterm      AB  2   Living  1      SAB  1   IAB  1   Ectopic  0   Multiple  0   Live Births  1            Home Medications    Prior to Admission medications   Medication Sig Start Date End Date Taking? Authorizing Provider  methocarbamol (ROBAXIN) 500 MG tablet Take 1 tablet (500 mg total) by mouth 2 (two) times daily. 01/20/19   Suzy Bouchard, PA-C  metroNIDAZOLE (FLAGYL) 500 MG tablet Take 1 tablet (500 mg total) by mouth 2 (two) times daily. 04/03/19   Darr, Edison Nasuti, PA-C  naproxen (NAPROSYN) 500 MG tablet Take 1 tablet (500 mg total) by mouth 2 (two) times daily. 01/20/19   Suzy Bouchard, PA-C    Family History Family History  Problem Relation Age of Onset   Heart disease Mother    Healthy Father     Social History Social History   Tobacco Use   Smoking status: Every Day    Packs/day: 0.50    Years: 4.00    Pack years: 2.00    Types: Cigarettes   Smokeless tobacco: Never   Substance Use Topics   Alcohol use: Yes   Drug use: Yes    Types: Marijuana     Allergies   Patient has no known allergies.   Review of Systems Review of Systems  Constitutional: Negative.   Cardiovascular: Negative.   Gastrointestinal: Negative.   Genitourinary:  Positive for vaginal discharge. Negative for decreased urine volume, difficulty urinating, dyspareunia, dysuria, enuresis, flank pain, frequency, genital sores, hematuria, menstrual problem, pelvic pain, urgency, vaginal bleeding and vaginal pain.  Skin: Negative.   Neurological: Negative.     Physical Exam Triage Vital Signs ED Triage Vitals  Enc Vitals Group     BP 03/25/21 1422 (!) 106/54     Pulse Rate 03/25/21 1422 77     Resp 03/25/21 1422 18     Temp 03/25/21 1422 98.6 F (37 C)     Temp Source 03/25/21 1422 Oral     SpO2 03/25/21 1422 97 %     Weight --      Height --      Head Circumference --      Peak Flow --  Pain Score 03/25/21 1420 0     Pain Loc --      Pain Edu? --      Excl. in GC? --    No data found.  Updated Vital Signs BP (!) 106/54   Pulse 77   Temp 98.6 F (37 C) (Oral)   Resp 18   LMP 02/28/2021   SpO2 97%   Visual Acuity Right Eye Distance:   Left Eye Distance:   Bilateral Distance:    Right Eye Near:   Left Eye Near:    Bilateral Near:     Physical Exam Constitutional:      Appearance: Normal appearance. She is normal weight.  Eyes:     Extraocular Movements: Extraocular movements intact.  Pulmonary:     Effort: Pulmonary effort is normal.  Genitourinary:    Comments: Deferred, self collect vaginal swab Skin:    General: Skin is warm and dry.  Neurological:     Mental Status: She is alert and oriented to person, place, and time. Mental status is at baseline.  Psychiatric:        Mood and Affect: Mood normal.        Behavior: Behavior normal.     UC Treatments / Results  Labs (all labs ordered are listed, but only abnormal results are  displayed) Labs Reviewed  POCT URINALYSIS DIPSTICK, ED / UC - Abnormal; Notable for the following components:      Result Value   Bilirubin Urine SMALL (*)    Ketones, ur TRACE (*)    Protein, ur 30 (*)    Leukocytes,Ua TRACE (*)    All other components within normal limits  CERVICOVAGINAL ANCILLARY ONLY    EKG   Radiology No results found.  Procedures Procedures (including critical care time)  Medications Ordered in UC Medications - No data to display  Initial Impression / Assessment and Plan / UC Course  I have reviewed the triage vital signs and the nursing notes.  Pertinent labs & imaging results that were available during my care of the patient were reviewed by me and considered in my medical decision making (see chart for details).  Vaginal discharge  Symptoms are consistent with bacterial vaginosis, will treat prophylactically while STI screening is pending, advised abstinence until all treatment is complete and symptoms have resolved, will treat per protocol for remaining infections, advised condom use moving forward with all sexual encounters, urinalysis negative, urine pregnancy negative, discussed results with patient Final Clinical Impressions(s) / UC Diagnoses   Final diagnoses:  None   Discharge Instructions   None    ED Prescriptions   None    PDMP not reviewed this encounter.   Valinda Hoar, NP 03/25/21 1454

## 2021-03-26 LAB — CERVICOVAGINAL ANCILLARY ONLY
Bacterial Vaginitis (gardnerella): POSITIVE — AB
Candida Glabrata: NEGATIVE
Candida Vaginitis: NEGATIVE
Chlamydia: NEGATIVE
Comment: NEGATIVE
Comment: NEGATIVE
Comment: NEGATIVE
Comment: NEGATIVE
Comment: NEGATIVE
Comment: NORMAL
Neisseria Gonorrhea: NEGATIVE
Trichomonas: NEGATIVE

## 2021-06-06 ENCOUNTER — Ambulatory Visit (HOSPITAL_COMMUNITY)
Admission: EM | Admit: 2021-06-06 | Discharge: 2021-06-06 | Discharge status: Against Medical Advice | Payer: No Typology Code available for payment source

## 2021-11-15 ENCOUNTER — Encounter (HOSPITAL_COMMUNITY): Payer: Self-pay

## 2021-11-15 ENCOUNTER — Ambulatory Visit (HOSPITAL_COMMUNITY)
Admission: EM | Admit: 2021-11-15 | Discharge: 2021-11-15 | Disposition: A | Discharge status: Home / Self Care | Payer: Medicaid Other | Attending: Emergency Medicine | Admitting: Emergency Medicine

## 2021-11-15 DIAGNOSIS — N760 Acute vaginitis: Secondary | ICD-10-CM | POA: Diagnosis not present

## 2021-11-15 DIAGNOSIS — B9689 Other specified bacterial agents as the cause of diseases classified elsewhere: Secondary | ICD-10-CM | POA: Insufficient documentation

## 2021-11-15 DIAGNOSIS — Z113 Encounter for screening for infections with a predominantly sexual mode of transmission: Secondary | ICD-10-CM | POA: Diagnosis not present

## 2021-11-15 DIAGNOSIS — H9202 Otalgia, left ear: Secondary | ICD-10-CM | POA: Diagnosis present

## 2021-11-15 LAB — POC URINE PREG, ED: Preg Test, Ur: NEGATIVE

## 2021-11-15 LAB — HIV ANTIBODY (ROUTINE TESTING W REFLEX): HIV Screen 4th Generation wRfx: NONREACTIVE

## 2021-11-15 MED ORDER — METRONIDAZOLE 500 MG PO TABS: 500.0000 mg | ORAL_TABLET | Freq: Two times a day (BID) | ORAL | 0 refills | Status: AC
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
## 2021-11-15 NOTE — Discharge Instructions (Addendum)
I have sent Flagyl to your pharmacy to treat for possible BV. We will call you with any results on your swab or blood work returned positive.  Please contact the health department or emergency department for drug testing.  I also recommend contacting the police department for your concerns.

## 2021-11-15 NOTE — ED Triage Notes (Signed)
Pt states she thinks she has a bug in her left ear.

## 2021-11-16 ENCOUNTER — Emergency Department (HOSPITAL_COMMUNITY)
Admission: EM | Admit: 2021-11-16 | Discharge: 2021-11-18 | Disposition: A | Discharge status: Other Acute Inpatient Hospital | Payer: Medicaid Other | Attending: Emergency Medicine | Admitting: Emergency Medicine

## 2021-11-16 ENCOUNTER — Other Ambulatory Visit: Payer: Self-pay

## 2021-11-16 DIAGNOSIS — F309 Manic episode, unspecified: Secondary | ICD-10-CM | POA: Diagnosis not present

## 2021-11-16 DIAGNOSIS — F1410 Cocaine abuse, uncomplicated: Secondary | ICD-10-CM

## 2021-11-16 DIAGNOSIS — Z23 Encounter for immunization: Secondary | ICD-10-CM | POA: Insufficient documentation

## 2021-11-16 DIAGNOSIS — F29 Unspecified psychosis not due to a substance or known physiological condition: Secondary | ICD-10-CM | POA: Diagnosis not present

## 2021-11-16 DIAGNOSIS — Z20822 Contact with and (suspected) exposure to covid-19: Secondary | ICD-10-CM | POA: Diagnosis not present

## 2021-11-16 DIAGNOSIS — N9489 Other specified conditions associated with female genital organs and menstrual cycle: Secondary | ICD-10-CM | POA: Diagnosis not present

## 2021-11-16 DIAGNOSIS — W268XXA Contact with other sharp object(s), not elsewhere classified, initial encounter: Secondary | ICD-10-CM | POA: Diagnosis not present

## 2021-11-16 DIAGNOSIS — S61012A Laceration without foreign body of left thumb without damage to nail, initial encounter: Secondary | ICD-10-CM | POA: Diagnosis not present

## 2021-11-16 DIAGNOSIS — S61011A Laceration without foreign body of right thumb without damage to nail, initial encounter: Secondary | ICD-10-CM | POA: Diagnosis not present

## 2021-11-16 DIAGNOSIS — S6991XA Unspecified injury of right wrist, hand and finger(s), initial encounter: Secondary | ICD-10-CM | POA: Diagnosis present

## 2021-11-16 DIAGNOSIS — Z79899 Other long term (current) drug therapy: Secondary | ICD-10-CM | POA: Diagnosis not present

## 2021-11-16 LAB — COMPREHENSIVE METABOLIC PANEL
ALT: 13 U/L (ref 0–44)
AST: 20 U/L (ref 15–41)
Albumin: 4.8 g/dL (ref 3.5–5.0)
Alkaline Phosphatase: 68 U/L (ref 38–126)
Anion gap: 7 (ref 5–15)
BUN: 17 mg/dL (ref 6–20)
CO2: 19 mmol/L — ABNORMAL LOW (ref 22–32)
Calcium: 9.9 mg/dL (ref 8.9–10.3)
Chloride: 114 mmol/L — ABNORMAL HIGH (ref 98–111)
Creatinine, Ser: 1.52 mg/dL — ABNORMAL HIGH (ref 0.44–1.00)
GFR, Estimated: 49 mL/min — ABNORMAL LOW (ref >60)
Glucose, Bld: 94 mg/dL (ref 70–99)
Potassium: 3.7 mmol/L (ref 3.5–5.1)
Sodium: 140 mmol/L (ref 135–145)
Total Bilirubin: 1.2 mg/dL (ref 0.3–1.2)
Total Protein: 7.7 g/dL (ref 6.5–8.1)

## 2021-11-16 LAB — I-STAT BETA HCG BLOOD, ED (MC, WL, AP ONLY): I-stat hCG, quantitative: <5 m[IU]/mL (ref <5)

## 2021-11-16 LAB — CBC WITH DIFFERENTIAL/PLATELET
Abs Immature Granulocytes: 0.15 10*3/uL — ABNORMAL HIGH (ref 0.00–0.07)
Basophils Absolute: 0.1 10*3/uL (ref 0.0–0.1)
Basophils Relative: 0 %
Eosinophils Absolute: 0 10*3/uL (ref 0.0–0.5)
Eosinophils Relative: 0 %
HCT: 42.8 % (ref 36.0–46.0)
Hemoglobin: 14.4 g/dL (ref 12.0–15.0)
Immature Granulocytes: 1 %
Lymphocytes Relative: 6 %
Lymphs Abs: 1.3 10*3/uL (ref 0.7–4.0)
MCH: 31 pg (ref 26.0–34.0)
MCHC: 33.6 g/dL (ref 30.0–36.0)
MCV: 92.2 fL (ref 80.0–100.0)
Monocytes Absolute: 2 10*3/uL — ABNORMAL HIGH (ref 0.1–1.0)
Monocytes Relative: 9 %
Neutro Abs: 18 10*3/uL — ABNORMAL HIGH (ref 1.7–7.7)
Neutrophils Relative %: 84 %
Platelets: 272 10*3/uL (ref 150–400)
RBC: 4.64 MIL/uL (ref 3.87–5.11)
RDW: 13.7 % (ref 11.5–15.5)
WBC: 21.5 10*3/uL — ABNORMAL HIGH (ref 4.0–10.5)
nRBC: 0 % (ref 0.0–0.2)

## 2021-11-16 LAB — ETHANOL: Alcohol, Ethyl (B): <10 mg/dL (ref <10)

## 2021-11-16 LAB — RESP PANEL BY RT-PCR (FLU A&B, COVID) ARPGX2
Influenza A by PCR: NEGATIVE
Influenza B by PCR: NEGATIVE
SARS Coronavirus 2 by RT PCR: NEGATIVE

## 2021-11-16 LAB — RPR: RPR Ser Ql: NONREACTIVE

## 2021-11-16 LAB — SALICYLATE LEVEL: Salicylate Lvl: <7 mg/dL — ABNORMAL LOW (ref 7.0–30.0)

## 2021-11-16 LAB — ACETAMINOPHEN LEVEL: Acetaminophen (Tylenol), Serum: <10 ug/mL — ABNORMAL LOW (ref 10–30)

## 2021-11-16 MED ORDER — SODIUM CHLORIDE 0.9 % IV BOLUS: 1000.0000 mL | Freq: Once | INTRAVENOUS | Status: DC

## 2021-11-16 MED ORDER — TETANUS-DIPHTH-ACELL PERTUSSIS 5-2.5-18.5 LF-MCG/0.5 IM SUSY
0.5000 mL | PREFILLED_SYRINGE | Freq: Once | INTRAMUSCULAR | Status: AC
Start: 2021-11-16 — End: 2021-11-16
  Administered 2021-11-16: 0.5 mL via INTRAMUSCULAR
  Filled 2021-11-16: qty 0.5

## 2021-11-16 MED ORDER — LORAZEPAM 2 MG/ML IJ SOLN
2.0000 mg | Freq: Once | INTRAMUSCULAR | Status: AC
Start: 2021-11-16 — End: 2021-11-16
  Administered 2021-11-16: 2 mg via INTRAMUSCULAR
  Filled 2021-11-16: qty 1

## 2021-11-16 MED ORDER — ZIPRASIDONE MESYLATE 20 MG IM SOLR
20.0000 mg | Freq: Once | INTRAMUSCULAR | Status: AC
  Administered 2021-11-16: 20 mg via INTRAMUSCULAR
  Filled 2021-11-16: qty 20

## 2021-11-16 MED ORDER — ZIPRASIDONE MESYLATE 20 MG IM SOLR: 10.0000 mg | Freq: Two times a day (BID) | INTRAMUSCULAR | Status: DC | PRN

## 2021-11-16 MED ORDER — IBUPROFEN 400 MG PO TABS
600.0000 mg | ORAL_TABLET | Freq: Four times a day (QID) | ORAL | Status: DC | PRN
  Administered 2021-11-16: 600 mg via ORAL
  Filled 2021-11-16: qty 1

## 2021-11-16 MED ORDER — LIDOCAINE HCL (PF) 1 % IJ SOLN
5.0000 mL | Freq: Once | INTRAMUSCULAR | Status: AC
  Administered 2021-11-16: 5 mL
  Filled 2021-11-16: qty 5

## 2021-11-16 MED ORDER — HYDROXYZINE HCL 25 MG PO TABS: 25.0000 mg | ORAL_TABLET | Freq: Three times a day (TID) | ORAL | Status: DC | PRN

## 2021-11-16 MED ORDER — OLANZAPINE 5 MG PO TBDP
10.0000 mg | ORAL_TABLET | Freq: Every day | ORAL | Status: DC
  Administered 2021-11-16 – 2021-11-17 (×2): 10 mg via ORAL
  Filled 2021-11-16 (×2): qty 2

## 2021-11-16 MED ORDER — ONDANSETRON 4 MG PO TBDP
4.0000 mg | ORAL_TABLET | Freq: Three times a day (TID) | ORAL | Status: DC | PRN
  Administered 2021-11-16: 4 mg via ORAL
  Filled 2021-11-16: qty 1
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
## 2021-11-16 NOTE — ED Notes (Addendum)
Assumed care of pt. Pt arrived asleep but woke up when being moved from stretcher to bed. Pt changed into scrubs and earrings, necklace and ring removed along with black bra and shirt and placed in belongings bag and locked in locker. Pt did not sign d/t going quickly back to sleep. Pt has one sock on but asked [place] to leave her alone. Pt calling out asking to call her daughter, upset about being given medications for sedation, and saying she is in pain. Assessment completed and pt back asleep shortly after assessment

## 2021-11-16 NOTE — ED Notes (Signed)
Pt woke up and stated she needed to vomit. Emesis bags given to pt and pt vomited liquid. Notified EDP and asked for PRN nausea and pain medicine. Pt went back to sleep, will save for when meds are needed later
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
## 2021-11-16 NOTE — ED Notes (Signed)
Pt woke up again to vomit. This nurse obtained PRN medications. Pt stated "I just want the ibuprofen." Explained to patient she just vomited and the zofran would help with her nausea and to keep the ibuprofen down. Pt agrees. Pt then asks for sprite, explained that pt is actively vomiting and we would have to do water until she is done vomiting. Pt asks for jug of water and this nurse explained we do not have jugs in ER. Pt exclaims "Ahhhh I can't do this!" Pt took medicine and laid back down
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
## 2021-11-17 ENCOUNTER — Encounter (HOSPITAL_COMMUNITY): Payer: Self-pay | Admitting: Emergency Medicine

## 2021-11-17 LAB — CERVICOVAGINAL ANCILLARY ONLY
Bacterial Vaginitis (gardnerella): POSITIVE — AB
Candida Glabrata: NEGATIVE
Candida Vaginitis: NEGATIVE
Chlamydia: NEGATIVE
Comment: NEGATIVE
Comment: NEGATIVE
Comment: NEGATIVE
Comment: NEGATIVE
Comment: NEGATIVE
Comment: NORMAL
Neisseria Gonorrhea: NEGATIVE
Trichomonas: NEGATIVE

## 2021-11-17 LAB — RAPID URINE DRUG SCREEN, HOSP PERFORMED
Amphetamines: NOT DETECTED
Barbiturates: NOT DETECTED
Benzodiazepines: POSITIVE — AB
Cocaine: POSITIVE — AB
Opiates: NOT DETECTED
Tetrahydrocannabinol: POSITIVE — AB

## 2021-11-17 NOTE — ED Notes (Signed)
LT Simmons with [employer] notified of pt being transferred tomorrow to [facility].

## 2021-11-17 NOTE — ED Provider Notes (Addendum)
Emergency Medicine Observation Re-evaluation Note  Emma Wilcox is a 26 y.o. female, seen on rounds today.  Pt initially presented to the ED for complaints of Mental Health Problem and Laceration Currently, the patient is asleep.  Physical Exam  BP 129/79 (BP Location: Right Arm)   Pulse 97   Temp 98.5 F (36.9 C) (Oral)   Resp 15   LMP 11/01/2021 (Approximate)   SpO2 99%  Physical Exam General: NAD Cardiac: well perfused Lungs: even and unlabored Psych: no agitation  ED Course / MDM  EKG:EKG Interpretation  Date/Time:  Sunday November 16 2021 08:29:20 EDT Ventricular Rate:  150 PR Interval:  118 QRS Duration: 86 QT Interval:  274 QTC Calculation: 433 R Axis:   71 Text Interpretation: Sinus tachycardia Borderline repolarization abnormality Artifact in lead(s) I III aVL aVF Confirmed by Lockie Mola, Adam ([id number]) on 11/16/2021 10:08:45 AM  I have reviewed the labs performed to date as well as medications administered while in observation.  Recent changes in the last 24 hours include seen by psychiatry and recommendations included IVC for inpatient psychiatric treatment.  The case and suggested for psychosis to include Zyprexa 10 mg p.o. at bedtime.  Lacerations repaired previously on arrival in the ED and patient is currently medically cleared.  Plan  Current plan is for inpatient psychiatric admission.Emma Wilcox is under involuntary commitment.   Update 11:28AM Patient accepted to [facility] for admission 11/18/2021, anytime after 8am. Patient to present to the main campus upon arrival. Landry Mellow, MD is the accepting provider. Nurse report [phone]. Room number will be assigned upon her arrival to the facility. Due to noted IVC status, nursing to coordinate patient transport with the sheriff's department. Part 1 of EMTALA completed. Pt will need Part 4 prior to transport tomorrow.   Ernie Avena, MD 11/17/21 1601    Ernie Avena, MD 11/17/21 [phone]

## 2021-11-17 NOTE — ED Notes (Signed)
Non-adherent pads and ace wrap applied to pt's bilateral knees for comfort, pt has multiple abrasion on her bilateral knees.
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
## 2021-11-17 NOTE — BH Assessment (Addendum)
Inpatient Behavioral Health Placement  Pt continues to meet inpatient criteria per Hurley Cisco, NP. Requested the [facility] Lorin Glass, RN) @ 1058 to review and consider patient for admission to [facility]. Danika confirmed that [facility] has no beds available and patient was faxed out to the following facilities for consideration of bed placement.   Patient faxed to the following facilities for consideration of bed placement.   Destination Service Provider Request Status Selected Services Address Phone Fax Patient Preferred  [facility]  Pending - Request Sent N/A [address], [place] [address] [phone] --  [facility]  Pending - Request Sent N/A [phone] [place] [place] [address] [phone] --  [facility]  Pending - Request Sent N/A [address] [place], [place] [address] [phone] --  [facility]  Pending - Request Sent N/A [address], [place] [address] [phone] --  [facility]  Pending - Request Sent N/A [address], [place] [address] [phone] --  [facility]  Pending - Request Sent N/A [address] [place] [address] [phone] --  [facility]  Pending - Request Sent N/A [address], [place] [address] [phone] --  [facility]  Pending - Request Sent N/A [address] [place] [address] [phone] --  [facility]  Pending - Request Sent N/A [address], [place] [address] [phone] --  [facility]  Pending - Request Sent N/A [address] [place], [place] [address] [phone] --  [facility]  Pending - Request Sent N/A [address] [place] [address] [phone]

## 2021-11-17 NOTE — ED Notes (Signed)
Chart reviewed for IVC expiration; expires on November 23, 2021. 

## 2021-11-17 NOTE — BH Assessment (Addendum)
Inpatient Behavioral Health Placement  Patient accepted to [facility] for admission 11/18/2021, anytime after 8am. Patient to present to the main campus upon arrival. Landry Mellow, MD is the accepting provider. Nurse report [phone]. Room number will be assigned upon her arrival to the facility. Due to noted IVC status in EPIC, nursing will need to coordinate patient's transport with the sheriff's department. Patient's nurse (Gray Bernhardt, RN) and the [facility] provider Glynda Jaeger, RN) provided disposition updates.

## 2021-11-17 NOTE — ED Notes (Signed)
Pt given night time snacks and something to drink 

## 2021-11-17 NOTE — ED Notes (Signed)
Patient made a phone call to her mother. Patient tearful while talking on the phone.

## 2021-11-17 NOTE — ED Notes (Signed)
Pt currently given opportunity to take a shower at this time as she has not had one today.

## 2021-11-18 NOTE — ED Notes (Signed)
Pt up to BR, steady gait noted, pt request something to drink, Sprite given to pt

## 2021-11-18 NOTE — ED Notes (Signed)
Pt continues to sleep, NAD noted, even RR, sitter remains within view of pt for safety precautions, room remains secured, care on going, will continue to monitor.  

## 2021-11-18 NOTE — ED Provider Notes (Signed)
Emergency Medicine Observation Re-evaluation Note  Emma Wilcox is a 26 y.o. female, seen on rounds today.  Pt initially presented to the ED for complaints of Mental Health Problem and Laceration Currently, the patient is asleep.  Physical Exam  BP 138/82 (BP Location: Left Arm)   Pulse 86   Temp 98.2 F (36.8 C) (Oral)   Resp 20   LMP 11/01/2021 (Approximate)   SpO2 100%  Physical Exam General: Asleep in bed resting comfortably Cardiac: Regular rate and rhythm Lungs: No increased work of breathing Psych: Calm and cooperative, sleep in bed  ED Course / MDM  EKG:EKG Interpretation  Date/Time:  Sunday November 16 2021 08:29:20 EDT Ventricular Rate:  150 PR Interval:  118 QRS Duration: 86 QT Interval:  274 QTC Calculation: 433 R Axis:   71 Text Interpretation: Sinus tachycardia Borderline repolarization abnormality Artifact in lead(s) I III aVL aVF Confirmed by Lockie Mola, Adam ([id number]) on 11/16/2021 10:08:45 AM  I have reviewed the labs performed to date as well as medications administered while in observation.  Recent changes in the last 24 hours include no significant changes.  Patient excepted to [facility] by Dr. Landry Mellow.  Pending transfer today.  Plan  Current plan is for transfer to [facility] inpatient facility. Celisse Ciulla is under involuntary commitment.      Phoebe Sharps, DO 11/18/21 [phone]

## 2021-11-18 NOTE — ED Notes (Signed)
Pt continues to sleep, NAD noted, sitter within view of pt for safety, room secured, care on going, will continue to monitor.  

## 2021-11-18 NOTE — ED Notes (Signed)
Called [employer] office to transport patient to [facility].

## 2021-11-18 NOTE — ED Notes (Signed)
Breakfast order placed ?

## 2021-11-18 NOTE — ED Notes (Signed)
Pt appears to be sleeping, even RR and unlabored, NAD noted, sitter within view of pt for safety, room secured, care on going, will continue to monitor.  

## 2022-03-31 ENCOUNTER — Telehealth: Payer: Self-pay
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
## 2022-03-31 NOTE — Telephone Encounter (Signed)
Nurse received message from [facility] at [facility] shelter stating that client requested an appointment with nurse regarding mental health.  Nurse spoke with client who states now was not a good time.  Nurse informed client she would be at shelter this afternoon from 2:00 pm-5:00 pm and available to speak with. Client stated she would call nurse back.   Emma Wilcox D. Azucena Kuba MSN, Chartered loss adjuster Nurse [employer] [phone]

## 2022-04-01 ENCOUNTER — Encounter: Payer: Self-pay | Admitting: *Deleted
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
## 2022-08-12 ENCOUNTER — Ambulatory Visit (HOSPITAL_COMMUNITY): Payer: Medicaid Other

## 2022-09-21 ENCOUNTER — Other Ambulatory Visit: Payer: Self-pay

## 2022-09-21 ENCOUNTER — Encounter (HOSPITAL_COMMUNITY): Payer: Self-pay

## 2022-09-21 ENCOUNTER — Emergency Department (HOSPITAL_COMMUNITY)
Admission: EM | Admit: 2022-09-21 | Discharge: 2022-09-22 | Disposition: A | Discharge status: Other Acute Inpatient Hospital | Payer: Medicaid Other | Source: Home / Self Care | Attending: Emergency Medicine | Admitting: Emergency Medicine

## 2022-09-21 ENCOUNTER — Emergency Department (HOSPITAL_COMMUNITY)
Admission: EM | Admit: 2022-09-21 | Discharge: 2022-09-21 | Discharge status: Against Medical Advice | Payer: Medicaid Other | Attending: Emergency Medicine | Admitting: Emergency Medicine

## 2022-09-21 DIAGNOSIS — F17210 Nicotine dependence, cigarettes, uncomplicated: Secondary | ICD-10-CM | POA: Insufficient documentation

## 2022-09-21 DIAGNOSIS — Z046 Encounter for general psychiatric examination, requested by authority: Secondary | ICD-10-CM

## 2022-09-21 DIAGNOSIS — F200 Paranoid schizophrenia: Secondary | ICD-10-CM | POA: Diagnosis not present

## 2022-09-21 DIAGNOSIS — F1490 Cocaine use, unspecified, uncomplicated: Secondary | ICD-10-CM | POA: Diagnosis not present

## 2022-09-21 DIAGNOSIS — R4586 Emotional lability: Secondary | ICD-10-CM

## 2022-09-21 DIAGNOSIS — F419 Anxiety disorder, unspecified: Secondary | ICD-10-CM

## 2022-09-21 DIAGNOSIS — F1290 Cannabis use, unspecified, uncomplicated: Secondary | ICD-10-CM | POA: Insufficient documentation

## 2022-09-21 DIAGNOSIS — Z5321 Procedure and treatment not carried out due to patient leaving prior to being seen by health care provider: Secondary | ICD-10-CM | POA: Diagnosis not present

## 2022-09-21 DIAGNOSIS — F22 Delusional disorders: Secondary | ICD-10-CM | POA: Insufficient documentation

## 2022-09-21 DIAGNOSIS — F250 Schizoaffective disorder, bipolar type: Secondary | ICD-10-CM | POA: Diagnosis present

## 2022-09-21 LAB — I-STAT BETA HCG BLOOD, ED (MC, WL, AP ONLY): I-stat hCG, quantitative: <5 m[IU]/mL (ref <5)
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
## 2022-09-22 ENCOUNTER — Encounter (HOSPITAL_COMMUNITY): Payer: Self-pay | Admitting: Psychiatry

## 2022-09-22 ENCOUNTER — Inpatient Hospital Stay (HOSPITAL_COMMUNITY)
Admission: AD | Admit: 2022-09-22 | Discharge: 2022-09-28 | DRG: 885 | Disposition: A | Discharge status: Home / Self Care | Payer: Medicaid Other | Source: Intra-hospital | Attending: Psychiatry | Admitting: Psychiatry

## 2022-09-22 DIAGNOSIS — F1220 Cannabis dependence, uncomplicated: Secondary | ICD-10-CM | POA: Diagnosis present

## 2022-09-22 DIAGNOSIS — F17210 Nicotine dependence, cigarettes, uncomplicated: Secondary | ICD-10-CM | POA: Diagnosis present

## 2022-09-22 DIAGNOSIS — F250 Schizoaffective disorder, bipolar type: Secondary | ICD-10-CM | POA: Diagnosis present

## 2022-09-22 DIAGNOSIS — Z79899 Other long term (current) drug therapy: Secondary | ICD-10-CM | POA: Diagnosis not present

## 2022-09-22 DIAGNOSIS — F419 Anxiety disorder, unspecified: Secondary | ICD-10-CM | POA: Diagnosis present

## 2022-09-22 DIAGNOSIS — K30 Functional dyspepsia: Secondary | ICD-10-CM | POA: Diagnosis present

## 2022-09-22 DIAGNOSIS — F200 Paranoid schizophrenia: Secondary | ICD-10-CM | POA: Diagnosis not present

## 2022-09-22 DIAGNOSIS — G4700 Insomnia, unspecified: Secondary | ICD-10-CM | POA: Diagnosis present

## 2022-09-22 DIAGNOSIS — K5900 Constipation, unspecified: Secondary | ICD-10-CM | POA: Diagnosis present

## 2022-09-22 DIAGNOSIS — F1420 Cocaine dependence, uncomplicated: Secondary | ICD-10-CM | POA: Diagnosis present

## 2022-09-22 DIAGNOSIS — Z91148 Patient's other noncompliance with medication regimen for other reason: Secondary | ICD-10-CM | POA: Diagnosis not present

## 2022-09-22 DIAGNOSIS — F22 Delusional disorders: Secondary | ICD-10-CM | POA: Diagnosis present

## 2022-09-22 LAB — CBC WITH DIFFERENTIAL/PLATELET
Abs Immature Granulocytes: 0.03 10*3/uL (ref 0.00–0.07)
Basophils Absolute: 0.1 10*3/uL (ref 0.0–0.1)
Basophils Relative: 1 %
Eosinophils Absolute: 0.3 10*3/uL (ref 0.0–0.5)
Eosinophils Relative: 3 %
HCT: 40.3 % (ref 36.0–46.0)
Hemoglobin: 13.3 g/dL (ref 12.0–15.0)
Immature Granulocytes: 0 %
Lymphocytes Relative: 34 %
Lymphs Abs: 3.7 10*3/uL (ref 0.7–4.0)
MCH: 32 pg (ref 26.0–34.0)
MCHC: 33 g/dL (ref 30.0–36.0)
MCV: 96.9 fL (ref 80.0–100.0)
Monocytes Absolute: 0.9 10*3/uL (ref 0.1–1.0)
Monocytes Relative: 9 %
Neutro Abs: 5.8 10*3/uL (ref 1.7–7.7)
Neutrophils Relative %: 53 %
Platelets: 247 10*3/uL (ref 150–400)
RBC: 4.16 MIL/uL (ref 3.87–5.11)
RDW: 13.7 % (ref 11.5–15.5)
WBC: 10.8 10*3/uL — ABNORMAL HIGH (ref 4.0–10.5)
nRBC: 0 % (ref 0.0–0.2)

## 2022-09-22 LAB — COMPREHENSIVE METABOLIC PANEL
ALT: 20 U/L (ref 0–44)
AST: 17 U/L (ref 15–41)
Albumin: 3.9 g/dL (ref 3.5–5.0)
Alkaline Phosphatase: 59 U/L (ref 38–126)
Anion gap: 7 (ref 5–15)
BUN: 9 mg/dL (ref 6–20)
CO2: 24 mmol/L (ref 22–32)
Calcium: 8.9 mg/dL (ref 8.9–10.3)
Chloride: 111 mmol/L (ref 98–111)
Creatinine, Ser: 0.7 mg/dL (ref 0.44–1.00)
GFR, Estimated: >60 mL/min (ref >60)
Glucose, Bld: 123 mg/dL — ABNORMAL HIGH (ref 70–99)
Potassium: 3.6 mmol/L (ref 3.5–5.1)
Sodium: 142 mmol/L (ref 135–145)
Total Bilirubin: 0.4 mg/dL (ref 0.3–1.2)
Total Protein: 6.4 g/dL — ABNORMAL LOW (ref 6.5–8.1)

## 2022-09-22 LAB — RAPID URINE DRUG SCREEN, HOSP PERFORMED
Amphetamines: NOT DETECTED
Barbiturates: NOT DETECTED
Benzodiazepines: NOT DETECTED
Cocaine: POSITIVE — AB
Opiates: NOT DETECTED
Tetrahydrocannabinol: POSITIVE — AB

## 2022-09-22 LAB — ACETAMINOPHEN LEVEL: Acetaminophen (Tylenol), Serum: <10 ug/mL — ABNORMAL LOW (ref 10–30)

## 2022-09-22 LAB — SALICYLATE LEVEL: Salicylate Lvl: <7 mg/dL — ABNORMAL LOW (ref 7.0–30.0)

## 2022-09-22 LAB — ETHANOL: Alcohol, Ethyl (B): <10 mg/dL (ref <10)

## 2022-09-22 MED ORDER — HALOPERIDOL LACTATE 5 MG/ML IJ SOLN: 5.0000 mg | Freq: Three times a day (TID) | INTRAMUSCULAR | Status: DC | PRN

## 2022-09-22 MED ORDER — ACETAMINOPHEN 325 MG PO TABS
650.0000 mg | ORAL_TABLET | Freq: Four times a day (QID) | ORAL | Status: DC | PRN
  Administered 2022-09-26 – 2022-09-27 (×2): 650 mg via ORAL
  Filled 2022-09-22 (×2): qty 2

## 2022-09-22 MED ORDER — DIPHENHYDRAMINE HCL 25 MG PO CAPS: 50.0000 mg | ORAL_CAPSULE | Freq: Three times a day (TID) | ORAL | Status: DC | PRN

## 2022-09-22 MED ORDER — ALUM & MAG HYDROXIDE-SIMETH 200-200-20 MG/5ML PO SUSP: 30.0000 mL | ORAL | Status: DC | PRN

## 2022-09-22 MED ORDER — HALOPERIDOL 5 MG PO TABS
5.0000 mg | ORAL_TABLET | Freq: Three times a day (TID) | ORAL | Status: DC | PRN
  Administered 2022-09-22: 5 mg via ORAL
  Filled 2022-09-22: qty 1

## 2022-09-22 MED ORDER — DIPHENHYDRAMINE HCL 50 MG/ML IJ SOLN: 50.0000 mg | Freq: Three times a day (TID) | INTRAMUSCULAR | Status: DC | PRN

## 2022-09-22 MED ORDER — LORAZEPAM 1 MG PO TABS
2.0000 mg | ORAL_TABLET | Freq: Three times a day (TID) | ORAL | Status: DC | PRN
  Administered 2022-09-22: 2 mg via ORAL
  Filled 2022-09-22: qty 2

## 2022-09-22 MED ORDER — NICOTINE 21 MG/24HR TD PT24
21.0000 mg | MEDICATED_PATCH | Freq: Every day | TRANSDERMAL | Status: DC
  Filled 2022-09-22 (×2): qty 1

## 2022-09-22 MED ORDER — HALOPERIDOL 5 MG PO TABS: 5.0000 mg | ORAL_TABLET | Freq: Three times a day (TID) | ORAL | Status: DC | PRN

## 2022-09-22 MED ORDER — NICOTINE POLACRILEX 2 MG MT GUM
2.0000 mg | CHEWING_GUM | OROMUCOSAL | Status: DC | PRN
  Administered 2022-09-22 – 2022-09-27 (×5): 2 mg via ORAL
  Filled 2022-09-22: qty 1

## 2022-09-22 MED ORDER — LORAZEPAM 1 MG PO TABS: 2.0000 mg | ORAL_TABLET | Freq: Three times a day (TID) | ORAL | Status: DC | PRN

## 2022-09-22 MED ORDER — LORAZEPAM 2 MG/ML IJ SOLN: 2.0000 mg | Freq: Three times a day (TID) | INTRAMUSCULAR | Status: DC | PRN

## 2022-09-22 MED ORDER — TRAZODONE HCL 50 MG PO TABS
50.0000 mg | ORAL_TABLET | Freq: Every evening | ORAL | Status: DC | PRN
  Administered 2022-09-22 – 2022-09-27 (×6): 50 mg via ORAL
  Filled 2022-09-22 (×6): qty 1

## 2022-09-22 MED ORDER — MAGNESIUM HYDROXIDE 400 MG/5ML PO SUSP: 30.0000 mL | Freq: Every day | ORAL | Status: DC | PRN

## 2022-09-22 NOTE — ED Notes (Addendum)
3 copies of IVC papers given to Purple RN; 1st exam and re still needs to be completed at this time; K. Humes PA to [facility]

## 2022-09-22 NOTE — Progress Notes (Signed)
   09/22/22 1955  Psych Admission Type (Psych Patients Only)  Admission Status Involuntary  Psychosocial Assessment  Patient Complaints Anxiety;Depression;Crying spells  Eye Contact Fair  Facial Expression Sad;Worried  Affect Anxious;Sad;Depressed  Speech Soft  Interaction Assertive  Motor Activity Restless  Appearance/Hygiene In scrubs  Behavior Characteristics Anxious  Mood Depressed;Anxious  Thought Process  Coherency Circumstantial;Tangential  Content Preoccupation;Paranoia;Blaming others  Delusions Persecutory;Paranoid  Perception Hallucinations  Hallucination None reported or observed  Judgment Impaired  Confusion None  Danger to Self  Current suicidal ideation? Denies  Agreement Not to Harm Self Yes  Description of Agreement Verbal  Danger to Others  Danger to Others None reported or observed

## 2022-09-22 NOTE — Tx Team (Signed)
Initial Treatment Plan 09/22/2022 5:39 PM Emma Wilcox ZOX:[id number]    PATIENT STRESSORS: Educational concerns   Financial difficulties   Medication change or noncompliance   Occupational concerns   Substance abuse     PATIENT STRENGTHS: Capable of independent living  Manufacturing systems engineer  Physical Health  Supportive family/friends    PATIENT IDENTIFIED PROBLEMS: Active psychosis (+AVH)    Substance Abuse "I've been using cocaine and weed for like 2 years now".    Medication noncompliance "I don't like the medications because it makes me feel loopy & tired"             DISCHARGE CRITERIA:  Improved stabilization in mood, thinking, and/or behavior Verbal commitment to aftercare and medication compliance Withdrawal symptoms are absent or subacute and managed without 24-hour nursing intervention  PRELIMINARY DISCHARGE PLAN: Outpatient therapy Return to previous living arrangement  PATIENT/FAMILY INVOLVEMENT: This treatment plan has been presented to and reviewed with the patient, Emma Wilcox. The patient have been given the opportunity to ask questions and make suggestions.  Sherryl Manges, RN 09/22/2022, 5:39 PM
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
## 2022-09-22 NOTE — ED Notes (Signed)
TTS in process 

## 2022-09-22 NOTE — ED Notes (Signed)
Patient agitated after speaking with psychiatric team. Requesting to use her cell phone so that she can face-time her daughter. Patient informed that she cannot use her cellphone, but can make a phone call with the phone that is provided. Patient attempted to make a phone call, then slammed the phone when the person she called did not answer. Patient informed that this is inappropriate behavior and that others need to use this phone as well. Patient asked to return to her room. Patient is tearful and upset about having to be at this hospital.

## 2022-09-22 NOTE — Progress Notes (Signed)
Adult Psychoeducational Group Note  Date:  09/22/2022 Time:  8:27 PM  Group Topic/Focus:  Wrap-Up Group:   The focus of this group is to help patients review their daily goal of treatment and discuss progress on daily workbooks.  Participation Level:  Active  Participation Quality:  Appropriate  Affect:  Appropriate  Cognitive:  Appropriate  Insight: Appropriate  Engagement in Group:  Engaged  Modes of Intervention:  Discussion and Support  Additional Comments:  Pt states goal today was to see daughter and work towards doing what needs to be done to go home. Pt states really missing daughter. Pt became tearful. Support provided. Pt rates day a 5/10. Pt states a positive thing that happened today, was taking the first step to get better mentally.   Jem Castro Katrinka Blazing 09/22/2022, 8:27 PM
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
## 2022-09-22 NOTE — ED Provider Notes (Addendum)
Emergency Medicine Observation Re-evaluation Note  Emma Wilcox is a 27 y.o. female, seen on rounds today.  Pt initially presented to the ED for complaints of Psychiatric Evaluation Patient currently calm and cooperative. Normal appetite, at full meal tray. No trouble sleeping. Pt notes stressor in form of relationship with parent. Is feeling somewhat improved today, states misses her daughter.  Physical Exam  BP 105/62 (BP Location: Right Arm)   Pulse 77   Temp 99 F (37.2 C) (Oral)   Resp 16   Ht 1.6 m (5\' 3" )   Wt 65.8 kg   SpO2 100%   BMI 25.69 kg/m  Physical Exam General: calm, cooperative.  Cardiac: regular rate.  Lungs: breathing comfortably. Psych: calm and conversant. Transient becomes tearful when talks of missing daughter and stress related to relationship with parent. Denies thoughts of harm to self or others. Pt does not appear to be responding to internal stimuli. No hallucinations noted. Occasionally voices paranoid-type thought, and does appear mildly anxious at times.   ED Course / MDM    I have reviewed the labs performed to date as well as medications administered while in observation.  Recent changes in the last 24 hours include ED obs, med management, reassessment.   Plan  Current plan is for [facility] reassessment. Pt currently has inpatient psych as recommendation, but there is no update about whether patient can go to [facility] or [facility], and patient is not currently on any BH medication - will ask [facility] team to reassess and update plan.   Pt remains medically stable/clear. Dispo per [facility] team.       Cathren Laine, MD 09/22/22 [phone]  Pt accepted to [facility], Dr Sherron Flemings.  Pt alert, no distress. Vitals normal. Pt appear stable for transport/admission to [facility].      Cathren Laine, MD 09/22/22 1021
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
## 2022-09-22 NOTE — Progress Notes (Signed)
Pt was accepted to [facility] TODAY 09/22/2022, pending UDS and IVC paperwork faxed to [phone]. Bed assignment: 508-1  Pt meets inpatient criteria per Eligha Bridegroom, NP  Attending Physician will be Phineas Inches, MD  Report can be called to: - Adult unit: [phone]  Pt can arrive after [facility] to coordinate arrival time, pending discharges  Care Team Notified: [facility] Rona Ravens, RN, Eligha Bridegroom, NP, Rollen Sox, RN, and Arsenio Loader, NP  Cathie Beams, LCSW  09/22/2022 10:12 AM

## 2022-09-22 NOTE — ED Notes (Signed)
Patient went to bathroom, and was given a urine cup and stated she forgot to void in cup.

## 2022-09-22 NOTE — Plan of Care (Signed)
  Problem: Education: Goal: Emotional status will improve Outcome: Not Progressing Goal: Mental status will improve Outcome: Not Progressing   Problem: Health Behavior/Discharge Planning: Goal: Identification of resources available to assist in meeting health care needs will improve Outcome: Not Progressing   

## 2022-09-22 NOTE — ED Notes (Signed)
First Exam uploaded in the patients chart.

## 2022-09-23 ENCOUNTER — Encounter (HOSPITAL_COMMUNITY): Payer: Self-pay

## 2022-09-23 DIAGNOSIS — F1220 Cannabis dependence, uncomplicated: Secondary | ICD-10-CM | POA: Insufficient documentation

## 2022-09-23 DIAGNOSIS — F1420 Cocaine dependence, uncomplicated: Secondary | ICD-10-CM | POA: Insufficient documentation

## 2022-09-23 LAB — LIPID PANEL
Cholesterol: 146 mg/dL (ref 0–200)
HDL: 39 mg/dL — ABNORMAL LOW (ref >40)
LDL Cholesterol: 88 mg/dL (ref 0–99)
Total CHOL/HDL Ratio: 3.7 RATIO
Triglycerides: 94 mg/dL (ref <150)
VLDL: 19 mg/dL (ref 0–40)

## 2022-09-23 MED ORDER — HYDROXYZINE HCL 50 MG PO TABS
50.0000 mg | ORAL_TABLET | Freq: Once | ORAL | Status: AC
  Administered 2022-09-23: 50 mg via ORAL

## 2022-09-23 MED ORDER — HYDROXYZINE HCL 50 MG PO TABS
ORAL_TABLET | ORAL | Status: AC
  Filled 2022-09-23: qty 1

## 2022-09-23 MED ORDER — RISPERIDONE 1 MG PO TABS
1.0000 mg | ORAL_TABLET | Freq: Two times a day (BID) | ORAL | Status: DC
  Administered 2022-09-23 – 2022-09-24 (×2): 1 mg via ORAL
  Filled 2022-09-23 (×7): qty 1

## 2022-09-23 MED ORDER — GABAPENTIN 100 MG PO CAPS
100.0000 mg | ORAL_CAPSULE | Freq: Three times a day (TID) | ORAL | Status: DC
  Administered 2022-09-23 – 2022-09-28 (×14): 100 mg via ORAL
  Filled 2022-09-23 (×18): qty 1

## 2022-09-23 MED ORDER — HYDROXYZINE HCL 25 MG PO TABS
25.0000 mg | ORAL_TABLET | ORAL | Status: DC | PRN
  Administered 2022-09-23 – 2022-09-27 (×5): 25 mg via ORAL
  Filled 2022-09-23 (×5): qty 1

## 2022-09-23 NOTE — Progress Notes (Signed)
Adult Psychoeducational Group Note  Date:  09/23/2022 Time:  1:52 PM  Group Topic/Focus:  Making Healthy Choices:   The focus of this group is to help patients identify negative/unhealthy choices they were using prior to admission and identify positive/healthier coping strategies to replace them upon discharge.  Participation Level:  Active  Participation Quality:  Appropriate  Affect:  Appropriate  Cognitive:  Appropriate  Insight: Appropriate  Engagement in Group:  Engaged  Modes of Intervention:  Discussion  Additional Comments: The patient engaged in group  Emma Wilcox 09/23/2022, 1:52 PM

## 2022-09-23 NOTE — Group Note (Signed)
Recreation Therapy Group Note   Group Topic:Self-Esteem  Group Date: 09/23/2022 Start Time: 1020 End Time: 1050 Facilitators: Atsushi Yom-McCall, LRT,CTRS Location: [address] Dayroom   Goal Area(s) Addresses:  Patient will successfully identify positive attributes about themselves.  Patient will identify healthy ways to increase self-esteem. Patient will acknowledge benefit(s) of improved self-esteem.   Group Description:  Totika.  LRT and patients discussed the importance of self-esteem and why it's important.  LRT and patients then played a few rounds of Totika.  Consuello Masse is played like [place] except the blocks are different colors and patients answered questions that corresponded with each color.  LRT asked patients questions dealing with self-esteem during activity.   Affect/Mood: N/A   Participation Level: Did not attend    Clinical Observations/Individualized Feedback:     Plan: Continue to engage patient in RT group sessions 2-3x/week.   Aloni Chuang-McCall, LRT,CTRS  09/23/2022 1:10 PM

## 2022-09-23 NOTE — Progress Notes (Signed)
Recreation Therapy Notes  INPATIENT RECREATION THERAPY ASSESSMENT  Patient Details Name: Emma Wilcox MRN: [id number] DOB: [birth year] Today's Date: 09/23/2022       Information Obtained From: Patient  Able to Participate in Assessment/Interview: Yes  Patient Presentation: Alert (became tearful)  Reason for Admission (Per Patient): Other (Comments) ("mom called the police")  Patient Stressors: Other (Comment) (None identified)  Coping Skills:   Sports, TV, Music, Meditate, Exercise, Prayer, Art, Avoidance, Read, Hot Bath/Shower  Leisure Interests (2+):  Social - Family (spend time with daughter)  Frequency of Recreation/Participation: Other (Comment) ("once in a blue moon")  Awareness of Community Resources:  Yes  Community Resources:  [place], [place], Public affairs consultant  Current Use: Yes  If no, Barriers?:    Expressed Interest in [employer] Information: No  [employer] of Residence:  Engineer, technical sales  Patient Main Form of Transportation: Set designer  Patient Strengths:  Doing hair/nails, basketball  Patient Identified Areas of Improvement:  Attitude, Temper, things you say when angry  Patient Goal for Hospitalization:  "try to get counseling/housing before leaving"  Current SI (including self-harm):  No  Current HI:  No  Current AVH: No  Staff Intervention Plan: Group Attendance, Collaborate with Interdisciplinary Treatment Team  Consent to Intern Participation: N/A   Emma Wilcox, LRT,CTRS Emma Wilcox 09/23/2022, 2:04 PM

## 2022-09-23 NOTE — Progress Notes (Signed)
   09/23/22 0547  15 Minute Checks  Location Bedroom  Visual Appearance Calm  Behavior Composed  Sleep (Behavioral Health Patients Only)  Calculate sleep? (Click Yes once per 24 hr at 0600 safety check) Yes  Documented sleep last 24 hours 8.75

## 2022-09-23 NOTE — Plan of Care (Signed)
  Problem: Education: Goal: Emotional status will improve Outcome: Progressing Goal: Mental status will improve Outcome: Progressing   Problem: Health Behavior/Discharge Planning: Goal: Compliance with treatment plan for underlying cause of condition will improve Outcome: Progressing   

## 2022-09-23 NOTE — Progress Notes (Signed)
   09/23/22 1950  Psych Admission Type (Psych Patients Only)  Admission Status Involuntary  Psychosocial Assessment  Patient Complaints Depression;Crying spells  Eye Contact Fair  Facial Expression Sad;Worried  Affect Anxious;Sad;Depressed  Speech Soft  Interaction Assertive  Motor Activity Restless  Appearance/Hygiene Unremarkable  Behavior Characteristics Cooperative;Anxious  Mood Depressed;Anxious  Thought Process  Coherency Circumstantial;Tangential  Content Preoccupation;Paranoia;Blaming others  Delusions Persecutory;Paranoid  Perception WDL  Hallucination None reported or observed  Judgment Impaired  Confusion None  Danger to Self  Current suicidal ideation? Denies  Agreement Not to Harm Self Yes  Description of Agreement Verbal  Danger to Others  Danger to Others None reported or observed
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
## 2022-09-23 NOTE — H&P (Signed)
Psychiatric Admission Assessment Adult  Patient Identification: Emma Wilcox  MRN:  [id number]  Date of Evaluation:  09/23/2022  Chief Complaint: Worsening manic behaviors (delusional thinking, paranoia & disorganized behaviors.  Principal Diagnosis: Schizoaffective disorder, bipolar type (HCC)  Diagnosis:  Principal Problem:   Schizoaffective disorder, bipolar type (HCC) Active Problems:   Cocaine use disorder, moderate, dependence (HCC)   Cannabis use disorder, severe, dependence (HCC)  History of Present Illness: This is the first psychiatric admission/evaluation in this [facility] for this 27 year old Caucasian female with prior hx of mental illness & substance use disorders. Admitted to the [facility] from the [facility] hospital under IVC with complained of worsening delusional thoughts, disorganized behavior, non-compliance to her medication regimen & feeling of paranoia. After medical evaluation/stabilization, patient was transferred to the [facility] for further psychiatric evaluation/treatments. A review of her toxicology reports has positive THC & cocaine. Per chart review, patient reported at the ED that the reason that she does not take her mental health medications is because, they make feel tired & loopy. During this evaluation, Emma Wilcox reports,   "I got to the [facility] hospital ED 2 nights ago. My mom called the cops & told them that I needed to be admitted to the hospital. What happened was, I came home & was chilling earlier that day. I tried to go inside the house, but it was locked. I started banging on the door for my mom to open the door for me. She called the cops instead. When I was at the [facility] last year, I was told that I have bipolar disorder, schizophrenia & ADHD. I know I'm not any of those things.. I don't remember the names of the medicines that I was given at the [facility]. I really did not take them anyway because they made me feel dizzy & I was unable to remember  stuff. I was diagnosed with ADHD when I was 5-6. At the time, I was on Vyvanse & Adderall. There is nothing wrong with me. I'm not even depressed. I do not abuse any drugs or alcohol. I sleep well at home. My mom is always try to tell me that I'm crazy, but I'm not. All I need from you guys are resources for housing, counseling & to get back to my daughter".  Objective: Emma Wilcox is seen, chart reviewed. She presents alert, oriented, restless & as a fair historian. She is labile, switching from crying spells to being apologetic to crying again. Patient has a poor insight about her mental illness, drug use & manic behaviors. She says she does not need treatments. She is fixated in going home to be with her daughter. She is currently manic & will require treatment. This case is discussed with the attending psychiatrist. See the treatment recommendation below.  Associated Signs/Symptoms:  Depression Symptoms:  insomnia, psychomotor agitation, difficulty concentrating, anxiety,  (Hypo) Manic Symptoms:  Distractibility, Elevated Mood, Impulsivity, Irritable Mood, Labiality of Mood,  Anxiety Symptoms:  Excessive Worry,  Psychotic Symptoms:   Patient currently denies any AVH, delusional thoughts or paranoia. She does not appear to be responding to any internal stimuli.  PTSD Symptoms: NA  Total Time spent with patient: 1 hour  Past Psychiatric History: Patient reports was diagnosed with ADHD during her childhood years. Was hospitalized at the [facility] in [place] last for similar complaints. Patient is noncomplaint to her treatment regimen.  Is the patient at risk to self? No.  Has the patient been a  risk to self in the past 6 months? Yes.    Has the patient been a risk to self within the distant past? Yes.    Is the patient a risk to others? No.  Has the patient been a risk to others in the past 6 months? Yes.    Has the patient been a risk to others within the distant past? No.    [place] Scale:  Flowsheet Row [facility]) [facility] Most recent reading at 09/22/2022  3:00 PM ED from 09/21/2022 in [facility] Emergency Department at [facility] Most recent reading at 09/21/2022 11:21 PM ED from 09/21/2022 in [facility] Emergency Department at [facility] Most recent reading at 09/21/2022  5:56 PM  C-SSRS RISK CATEGORY No Risk No Risk No Risk      Prior Inpatient Therapy: Yes.   If yes, describe: [facility].   Prior Outpatient Therapy: Yes.   If yes, describe: Patient is unable to remember.   Alcohol Screening: 1. How often do you have a drink containing alcohol?: 4 or more times a week 2. How many drinks containing alcohol do you have on a typical day when you are drinking?: 7, 8, or 9 3. How often do you have six or more drinks on one occasion?: Weekly AUDIT-C Score: 10 4. How often during the last year have you found that you were not able to stop drinking once you had started?: Weekly 5. How often during the last year have you failed to do what was normally expected from you because of drinking?: Weekly 6. How often during the last year have you needed a first drink in the morning to get yourself going after a heavy drinking session?: Never 7. How often during the last year have you had a feeling of guilt of remorse after drinking?: Monthly 8. How often during the last year have you been unable to remember what happened the night before because you had been drinking?: Monthly 9. Have you or someone else been injured as a result of your drinking?: No 10. Has a relative or friend or a doctor or another health worker been concerned about your drinking or suggested you cut down?: Yes, during the last year Alcohol Use Disorder Identification Test Final Score (AUDIT): 24 Alcohol Brief Interventions/Follow-up: Alcohol education/Brief advice  Substance Abuse History in the last 12 months:   Yes.    Consequences of Substance Abuse: Discussed with patient during this admission evaluation. Medical Consequences:  Liver damage, Possible death by overdose Legal Consequences:  Arrests, jail time, Loss of driving privilege. Family Consequences:  Family discord, divorce and or separation.  Previous Psychotropic Medications: Yes   Psychological Evaluations: No   Past Medical History:  Past Medical History:  Diagnosis Date   Medical history non-contributory     Past Surgical History:  Procedure Laterality Date   MOUTH SURGERY     Family History:  Family History  Problem Relation Age of Onset   Heart disease Mother    Healthy Father    Family Psychiatric  History: Patient denies any familial hx of mental illnesses.  Tobacco Screening:  Social History   Tobacco Use  Smoking Status Every Day   Packs/day: 0.50   Years: 4.00   Additional pack years: 0.00   Total pack years: 2.00   Types: Cigarettes  Smokeless Tobacco Never    BH Tobacco Counseling     Are you interested in Tobacco  Cessation Medications?  No value filed. Counseled patient on smoking cessation:  No value filed. Reason Tobacco Screening Not Completed: No value filed.       Social History:  Social History   Substance and Sexual Activity  Alcohol Use Yes     Social History   Substance and Sexual Activity  Drug Use Yes   Types: Marijuana    Additional Social History:  Allergies:  No Known Allergies Lab Results:  Results for orders placed or performed during the hospital encounter of 09/22/22 (from the past 48 hour(s))  Lipid panel     Status: Abnormal   Collection Time: 09/23/22  6:36 AM  Result Value Ref Range   Cholesterol 146 0 - 200 mg/dL   Triglycerides 94 <914 mg/dL   HDL 39 (L) >78 mg/dL   Total CHOL/HDL Ratio 3.7 RATIO   VLDL 19 0 - 40 mg/dL   LDL Cholesterol 88 0 - 99 mg/dL    Comment:        Total Cholesterol/HDL:CHD Risk Coronary Heart Disease Risk Table                      Men   Women  1/2 Average Risk   3.4   3.3  Average Risk       5.0   4.4  2 X Average Risk   9.6   7.1  3 X Average Risk  23.4   11.0        Use the calculated Patient Ratio above and the CHD Risk Table to determine the patient's CHD Risk.        ATP III CLASSIFICATION (LDL):  <100     mg/dL   Optimal  295-621  mg/dL   Near or Above                    Optimal  130-159  mg/dL   Borderline  308-657  mg/dL   High  >846     mg/dL   Very High Performed at [facility], [address], [place], [place] [address]    Blood Alcohol level:  Lab Results  Component Value Date   ETH <10 09/21/2022   ETH <10 11/16/2021   Metabolic Disorder Labs:  No results found for: "HGBA1C", "MPG" No results found for: "PROLACTIN" Lab Results  Component Value Date   CHOL 146 09/23/2022   TRIG 94 09/23/2022   HDL 39 (L) 09/23/2022   CHOLHDL 3.7 09/23/2022   VLDL 19 09/23/2022   LDLCALC 88 09/23/2022   Current Medications: Current Facility-Administered Medications  Medication Dose Route Frequency Provider Last Rate Last Admin   acetaminophen (TYLENOL) tablet 650 mg  650 mg Oral Q6H PRN Lenox Ponds, NP       alum & mag hydroxide-simeth (MAALOX/MYLANTA) 200-200-20 MG/5ML suspension 30 mL  30 mL Oral Q4H PRN Lenox Ponds, NP       diphenhydrAMINE (BENADRYL) capsule 50 mg  50 mg Oral TID PRN Lenox Ponds, NP       Or   diphenhydrAMINE (BENADRYL) injection 50 mg  50 mg Intramuscular TID PRN Lenox Ponds, NP       gabapentin (NEURONTIN) capsule 100 mg  100 mg Oral TID Armandina Stammer I, NP       haloperidol (HALDOL) tablet 5 mg  5 mg Oral TID PRN Lenox Ponds, NP       Or   haloperidol lactate (HALDOL) injection 5  mg  5 mg Intramuscular TID PRN Lenox Ponds, NP       hydrOXYzine (ATARAX) 50 MG tablet            hydrOXYzine (ATARAX) tablet 25 mg  25 mg Oral Q4H PRN Armandina Stammer I, NP       LORazepam (ATIVAN) tablet 2 mg  2 mg Oral TID PRN Lenox Ponds,  NP       Or   LORazepam (ATIVAN) injection 2 mg  2 mg Intramuscular TID PRN Lenox Ponds, NP       magnesium hydroxide (MILK OF MAGNESIA) suspension 30 mL  30 mL Oral Daily PRN Lenox Ponds, NP       nicotine polacrilex (NICORETTE) gum 2 mg  2 mg Oral PRN Onuoha, Chinwendu V, NP   2 mg at 09/23/22 1302   risperiDONE (RISPERDAL) tablet 1 mg  1 mg Oral BID Armandina Stammer I, NP       traZODone (DESYREL) tablet 50 mg  50 mg Oral QHS PRN Lenox Ponds, NP   50 mg at 09/22/22 2027   PTA Medications: No medications prior to admission.   Musculoskeletal: Strength & Muscle Tone: within normal limits Gait & Station: normal Patient leans: N/A  Psychiatric Specialty Exam:  Presentation  General Appearance:  Disheveled  Eye Contact: Good  Speech: Clear and Coherent; Pressured  Speech Volume: Increased  Handedness:Right   Mood and Affect  Mood: Anxious; Labile  Affect: Congruent  Thought Process  Thought Processes: Disorganized  Duration of Psychotic Symptoms:N/A  Past Diagnosis of Schizophrenia or Psychoactive disorder: Yes  Descriptions of Associations:Intact  Orientation:Full (Time, Place and Person)  Thought Content:Tangential; Rumination  Hallucinations:Hallucinations: None  Ideas of Reference:None  Suicidal Thoughts:Suicidal Thoughts: No  Homicidal Thoughts:Homicidal Thoughts: No  Sensorium  Memory: Immediate Good; Recent Good; Remote Fair  Judgment: Poor  Insight: Lacking  Executive Functions  Concentration: Poor  Attention Span: Poor  Recall:No data recorded Fund of Knowledge: Fair  Language: Good  Psychomotor Activity  Psychomotor Activity:Psychomotor Activity: Restlessness; Increased   Assets  Assets: Manufacturing systems engineer; Housing; Social Support; Physical Health  Sleep  Sleep:Sleep: Good Number of Hours of Sleep: 6.5  Physical Exam: Physical Exam Vitals and nursing note reviewed.  HENT:     Nose: Nose normal.      Mouth/Throat:     Pharynx: Oropharynx is clear.  Eyes:     Pupils: Pupils are equal, round, and reactive to light.  Cardiovascular:     Rate and Rhythm: Normal rate.     Pulses: Normal pulses.  Pulmonary:     Effort: Pulmonary effort is normal.  Genitourinary:    Comments: Deferred Musculoskeletal:        General: Normal range of motion.     Cervical back: Normal range of motion.  Skin:    General: Skin is warm and dry.  Neurological:     General: No focal deficit present.     Mental Status: She is alert and oriented to person, place, and time.    Review of Systems  Constitutional:  Negative for chills, diaphoresis and fever.  HENT:  Negative for congestion and sore throat.   Respiratory:  Negative for cough, shortness of breath and wheezing.   Cardiovascular:  Negative for chest pain and palpitations.  Gastrointestinal:  Negative for abdominal pain, diarrhea, heartburn, nausea and vomiting.  Neurological:  Negative for dizziness, tingling, tremors, sensory change, speech change, focal weakness, seizures, loss of consciousness, weakness and headaches.  Endo/Heme/Allergies:        NKDA  Psychiatric/Behavioral:  Positive for substance abuse. Negative for depression, hallucinations, memory loss and suicidal ideas. The patient is nervous/anxious and has insomnia.    Blood pressure 131/68, pulse 85, temperature 98.4 F (36.9 C), temperature source Oral, resp. rate 16, height 5\' 3"  (1.6 m), weight 59.4 kg, SpO2 100 %. Body mass index is 23.21 kg/m.  Treatment Plan Summary: Daily contact with patient to assess and evaluate symptoms and progress in treatment and Medication management.   Principal/active diagnoses.  Schizoaffective disorder, bipolar type (HCC).   Plan: The risks/benefits/side-effects/alternatives to the medications in use were discussed in detail with the patient and time was given for patient's questions. The patient consents to medication trial.   Initiated  Risperdal 1 mg po bid for mood control.  Initiated gabapentin 100 mg po tid for agitation/anxiety. Continue hydroxyzine 25 mg po Q 4 hrs prn for anxiety.  Continue Trazodone 50 mg po Q bedtime prn for insomnia.  Continue Nicorette gum 2 mg po prn for nicotine withdrawal.   Agitation protocols: Cont as recommended;  -Benadryl 50 mg po or IM tid prn. -Haldol 5 mg po or IM tid prn.  -Lorazepam 2 mg po or IM tid prn.  Other PRNS -Continue Tylenol 650 mg every 6 hours PRN for mild pain -Continue Maalox 30 ml Q 4 hrs PRN for indigestion -Continue MOM 30 ml po Q 6 hrs for constipation  Safety and Monitoring: Voluntary admission to inpatient psychiatric unit for safety, stabilization and treatment Daily contact with patient to assess and evaluate symptoms and progress in treatment Patient's case to be discussed in multi-disciplinary team meeting Observation Level : q15 minute checks Vital signs: q12 hours Precautions: Safety  Discharge Planning: Social work and case management to assist with discharge planning and identification of hospital follow-up needs prior to discharge Estimated LOS: 5-7 days Discharge Concerns: Need to establish a safety plan; Medication compliance and effectiveness Discharge Goals: Return home with outpatient referrals for mental health follow-up including medication management/psychotherapy  Observation Level/Precautions:  15 minute checks  Laboratory:   Per ED, current lab results reviewed. UDS (+) for Cocaine & THC.  Psychotherapy: Enrolled in the group milieu.   Medications: See MAR.   Consultations: As needed,   Discharge Concerns:    Estimated LOS: 7 days  Other: NA   Physician Treatment Plan for Primary Diagnosis: Schizoaffective disorder, bipolar type (HCC)  Long Term Goal(s): Improvement in symptoms so as ready for discharge  Short Term Goals: Ability to identify changes in lifestyle to reduce recurrence of condition will improve, Ability to  verbalize feelings will improve, Ability to disclose and discuss suicidal ideas, and Ability to demonstrate self-control will improve  Physician Treatment Plan for Secondary Diagnosis: Principal Problem:   Schizoaffective disorder, bipolar type (HCC) Active Problems:   Cocaine use disorder, moderate, dependence (HCC)   Cannabis use disorder, severe, dependence (HCC)  Long Term Goal(s): Improvement in symptoms so as ready for discharge  Short Term Goals: Ability to identify and develop effective coping behaviors will improve, Ability to maintain clinical measurements within normal limits will improve, Compliance with prescribed medications will improve, and Ability to identify triggers associated with substance abuse/mental health issues will improve  I certify that inpatient services furnished can reasonably be expected to improve the patient's condition.    Armandina Stammer, NP, pmhnp, fnp-bc 6/5/20244:11 PM

## 2022-09-23 NOTE — [facility] Suicide Risk Assessment (Signed)
Suicide Risk Assessment  Admission Assessment    [facility] Admission Suicide Risk Assessment   Nursing information obtained from:  Patient  Demographic factors:  Low socioeconomic status, Unemployed  Current Mental Status:  NA  Loss Factors:  Decrease in vocational status, Financial problems / change in socioeconomic status  Historical Factors:  Impulsivity  Risk Reduction Factors:  Responsible for children under 9 years of age  Total Time spent with patient: 1 hour  Principal Problem: Schizoaffective disorder, bipolar type (HCC)  Diagnosis:  Principal Problem:   Schizoaffective disorder, bipolar type (HCC)  Subjective Data: See H&P.  Continued Clinical Symptoms:  Alcohol Use Disorder Identification Test Final Score (AUDIT): 24 The "Alcohol Use Disorders Identification Test", Guidelines for Use in Primary Care, Second Edition.  World Science writer [facility]). Score between 0-7:  no or low risk or alcohol related problems. Score between 8-15:  moderate risk of alcohol related problems. Score between 16-19:  high risk of alcohol related problems. Score 20 or above:  warrants further diagnostic evaluation for alcohol dependence and treatment.  CLINICAL FACTORS:   Severe Anxiety and/or Agitation Bipolar Disorder:   Mixed State Alcohol/Substance Abuse/Dependencies More than one psychiatric diagnosis Unstable or Poor Therapeutic Relationship Previous Psychiatric Diagnoses and Treatments  Musculoskeletal: Strength & Muscle Tone: within normal limits Gait & Station: normal Patient leans: N/A  Psychiatric Specialty Exam:  Presentation  General Appearance:  Disheveled  Eye Contact: Good  Speech: Clear and Coherent; Pressured  Speech Volume: Increased  Handedness:Right   Mood and Affect  Mood: Anxious; Labile  Affect: Congruent   Thought Process  Thought Processes: Disorganized  Descriptions of Associations:Intact  Orientation:Full (Time, Place and  Person)  Thought Content:Tangential; Rumination  History of Schizophrenia/Schizoaffective disorder:Yes  Duration of Psychotic Symptoms:Greater than six months  Hallucinations:Hallucinations: None  Ideas of Reference:None  Suicidal Thoughts:Suicidal Thoughts: No  Homicidal Thoughts:Homicidal Thoughts: No   Sensorium  Memory: Immediate Good; Recent Good; Remote Fair  Judgment: Poor  Insight: Lacking   Executive Functions  Concentration: Poor  Attention Span: Poor  Recall:No data recorded Fund of Knowledge: Fair  Language: Good  Psychomotor Activity  Psychomotor Activity:Psychomotor Activity: Restlessness; Increased   Assets  Assets: Manufacturing systems engineer; Housing; Social Support; Physical Health   Sleep  Sleep:Sleep: Good Number of Hours of Sleep: 6.5   Physical Exam: See H&P. Blood pressure 131/68, pulse 85, temperature 98.4 F (36.9 C), temperature source Oral, resp. rate 16, height 5\' 3"  (1.6 m), weight 59.4 kg, SpO2 100 %. Body mass index is 23.21 kg/m.  COGNITIVE FEATURES THAT CONTRIBUTE TO RISK:  Closed-mindedness, Polarized thinking, and Thought constriction (tunnel vision)    SUICIDE RISK:   Severe:  Frequent, intense, and enduring suicidal ideation, specific plan, no subjective intent, but some objective markers of intent (i.e., choice of lethal method), the method is accessible, some limited preparatory behavior, evidence of impaired self-control, severe dysphoria/symptomatology, multiple risk factors present, and few if any protective factors, particularly a lack of social support.  PLAN OF CARE: See H&P.  I certify that inpatient services furnished can reasonably be expected to improve the patient's condition.   Armandina Stammer, NP, pmhnp, fnp-bc. 09/23/2022, 3:47 PM

## 2022-09-23 NOTE — Progress Notes (Signed)
Patient teary and irritable after phone conversation with family member.  PRN vistaril given with newly ordered gabapentin. Effective.   09/23/22 1300  Psych Admission Type (Psych Patients Only)  Admission Status Involuntary  Psychosocial Assessment  Patient Complaints Depression;Crying spells  Eye Contact Fair  Facial Expression Sad;Anxious  Affect Anxious;Depressed  Speech Soft  Interaction Assertive  Motor Activity Slow  Appearance/Hygiene Unremarkable  Behavior Characteristics Anxious;Cooperative  Mood Depressed  Aggressive Behavior  Effect No apparent injury  Thought Process  Coherency Circumstantial  Content Blaming others;Preoccupation  Delusions Persecutory  Perception WDL  Hallucination None reported or observed  Judgment Impaired  Confusion None  Danger to Self  Current suicidal ideation? Denies  Agreement Not to Harm Self Yes  Description of Agreement verbal  Danger to Others  Danger to Others None reported or observed

## 2022-09-23 NOTE — Progress Notes (Signed)
Adult Psychoeducational Group Note  Date:  09/23/2022 Time:  9:27 AM  Group Topic/Focus:  Goals Group:   The focus of this group is to help patients establish daily goals to achieve during treatment and discuss how the patient can incorporate goal setting into their daily lives to aide in recovery.  Participation Level:  Active  Participation Quality:  Appropriate  Affect:  Appropriate  Cognitive:  Appropriate  Insight: Appropriate  Engagement in Group:  Engaged  Modes of Intervention:  Discussion  Additional Comments:  Patient engaged in group to set goals.  Octavio Manns 09/23/2022, 9:27 AM

## 2022-09-23 NOTE — BH IP Treatment Plan (Signed)
Interdisciplinary Treatment and Diagnostic Plan Update  09/23/2022 Time of Session: 10:55 AM  Emma Wilcox MRN: [id number]  Principal Diagnosis: Schizoaffective disorder, bipolar type (HCC)  Secondary Diagnoses: Principal Problem:   Schizoaffective disorder, bipolar type (HCC)   Current Medications:  Current Facility-Administered Medications  Medication Dose Route Frequency Provider Last Rate Last Admin   acetaminophen (TYLENOL) tablet 650 mg  650 mg Oral Q6H PRN Lenox Ponds, NP       alum & mag hydroxide-simeth (MAALOX/MYLANTA) 200-200-20 MG/5ML suspension 30 mL  30 mL Oral Q4H PRN Lenox Ponds, NP       diphenhydrAMINE (BENADRYL) capsule 50 mg  50 mg Oral TID PRN Lenox Ponds, NP       Or   diphenhydrAMINE (BENADRYL) injection 50 mg  50 mg Intramuscular TID PRN Lenox Ponds, NP       gabapentin (NEURONTIN) capsule 100 mg  100 mg Oral TID Armandina Stammer I, NP       haloperidol (HALDOL) tablet 5 mg  5 mg Oral TID PRN Lenox Ponds, NP       Or   haloperidol lactate (HALDOL) injection 5 mg  5 mg Intramuscular TID PRN Lenox Ponds, NP       hydrOXYzine (ATARAX) 50 MG tablet            hydrOXYzine (ATARAX) tablet 25 mg  25 mg Oral Q4H PRN Armandina Stammer I, NP       LORazepam (ATIVAN) tablet 2 mg  2 mg Oral TID PRN Lenox Ponds, NP       Or   LORazepam (ATIVAN) injection 2 mg  2 mg Intramuscular TID PRN Lenox Ponds, NP       magnesium hydroxide (MILK OF MAGNESIA) suspension 30 mL  30 mL Oral Daily PRN Lenox Ponds, NP       nicotine polacrilex (NICORETTE) gum 2 mg  2 mg Oral PRN Onuoha, Chinwendu V, NP   2 mg at 09/23/22 1302   risperiDONE (RISPERDAL) tablet 1 mg  1 mg Oral BID Armandina Stammer I, NP       traZODone (DESYREL) tablet 50 mg  50 mg Oral QHS PRN Lenox Ponds, NP   50 mg at 09/22/22 2027   PTA Medications: No medications prior to admission.    Patient Stressors: Copy difficulties   Medication change or  noncompliance   Occupational concerns   Substance abuse    Patient Strengths: Capable of independent living  Manufacturing systems engineer  Physical Health  Supportive family/friends   Treatment Modalities: Medication Management, Group therapy, Case management,  1 to 1 session with clinician, Psychoeducation, Recreational therapy.   Physician Treatment Plan for Primary Diagnosis: Schizoaffective disorder, bipolar type (HCC) Long Term Goal(s): Improvement in symptoms so as ready for discharge   Short Term Goals: Ability to identify and develop effective coping behaviors will improve Ability to maintain clinical measurements within normal limits will improve Compliance with prescribed medications will improve Ability to identify triggers associated with substance abuse/mental health issues will improve Ability to identify changes in lifestyle to reduce recurrence of condition will improve Ability to verbalize feelings will improve Ability to disclose and discuss suicidal ideas Ability to demonstrate self-control will improve  Medication Management: Evaluate patient's response, side effects, and tolerance of medication regimen.  Therapeutic Interventions: 1 to 1 sessions, Unit Group sessions and Medication administration.  Evaluation of Outcomes: Not Progressing  Physician Treatment Plan for Secondary Diagnosis: Principal  Problem:   Schizoaffective disorder, bipolar type (HCC)  Long Term Goal(s): Improvement in symptoms so as ready for discharge   Short Term Goals: Ability to identify and develop effective coping behaviors will improve Ability to maintain clinical measurements within normal limits will improve Compliance with prescribed medications will improve Ability to identify triggers associated with substance abuse/mental health issues will improve Ability to identify changes in lifestyle to reduce recurrence of condition will improve Ability to verbalize feelings will  improve Ability to disclose and discuss suicidal ideas Ability to demonstrate self-control will improve     Medication Management: Evaluate patient's response, side effects, and tolerance of medication regimen.  Therapeutic Interventions: 1 to 1 sessions, Unit Group sessions and Medication administration.  Evaluation of Outcomes: Not Progressing   RN Treatment Plan for Primary Diagnosis: Schizoaffective disorder, bipolar type (HCC) Long Term Goal(s): Knowledge of disease and therapeutic regimen to maintain health will improve  Short Term Goals: Ability to remain free from injury will improve, Ability to verbalize frustration and anger appropriately will improve, Ability to demonstrate self-control, Ability to participate in decision making will improve, Ability to verbalize feelings will improve, Ability to disclose and discuss suicidal ideas, Ability to identify and develop effective coping behaviors will improve, and Compliance with prescribed medications will improve  Medication Management: RN will administer medications as ordered by provider, will assess and evaluate patient's response and provide education to patient for prescribed medication. RN will report any adverse and/or side effects to prescribing provider.  Therapeutic Interventions: 1 on 1 counseling sessions, Psychoeducation, Medication administration, Evaluate responses to treatment, Monitor vital signs and CBGs as ordered, Perform/monitor CIWA, COWS, AIMS and Fall Risk screenings as ordered, Perform wound care treatments as ordered.  Evaluation of Outcomes: Not Progressing   LCSW Treatment Plan for Primary Diagnosis: Schizoaffective disorder, bipolar type (HCC) Long Term Goal(s): Safe transition to appropriate next level of care at discharge, Engage patient in therapeutic group addressing interpersonal concerns.  Short Term Goals: Engage patient in aftercare planning with referrals and resources, Increase social support,  Increase ability to appropriately verbalize feelings, Increase emotional regulation, Facilitate acceptance of mental health diagnosis and concerns, Facilitate patient progression through stages of change regarding substance use diagnoses and concerns, Identify triggers associated with mental health/substance abuse issues, and Increase skills for wellness and recovery  Therapeutic Interventions: Assess for all discharge needs, 1 to 1 time with Social worker, Explore available resources and support systems, Assess for adequacy in community support network, Educate family and significant other(s) on suicide prevention, Complete Psychosocial Assessment, Interpersonal group therapy.  Evaluation of Outcomes: Not Progressing   Progress in Treatment: Attending groups: No. Participating in groups: No. Taking medication as prescribed: Yes. Toleration medication: Yes. Family/Significant other contact made: Yes, individual(s) contacted:  Rondell Reams (mother)- [phone] Patient understands diagnosis: No. Discussing patient identified problems/goals with staff: Yes. Medical problems stabilized or resolved: Yes. Denies suicidal/homicidal ideation: Yes. Issues/concerns per patient self-inventory: No.  New problem(s) identified: No, Describe:  None reported   New Short Term/Long Term Goal(s): medication stabilization, elimination of SI thoughts, development of comprehensive mental wellness plan.    Patient Goals:  " Figure out housing/counseling and get back to my daughter because my mom do not know how to take care of her "   Discharge Plan or Barriers: Patient recently admitted. CSW will continue to follow and assess for appropriate referrals and possible discharge planning.    Reason for Continuation of Hospitalization: Aggression Delusions  Mania Medication stabilization  Estimated Length  of Stay: 5-7 days   Last 3 [place] Suicide Severity Risk Score: Flowsheet Row Ad[facility] (Current) [facility]  [facility] Most recent reading at 09/22/2022  3:00 PM ED from 09/21/2022 in [facility] Emergency Department at [facility] Most recent reading at 09/21/2022 11:21 PM ED from 09/21/2022 in [facility] Emergency Department at [facility] Most recent reading at 09/21/2022  5:56 PM  C-SSRS RISK CATEGORY No Risk No Risk No Risk       Last PHQ 2/9 Scores:     No data to display          Scribe for Treatment Team: Beather Arbour 09/23/2022 2:49 PM

## 2022-09-23 NOTE — BHH Group Notes (Signed)
Adult Psychoeducational Group Note  Date:  09/23/2022 Time:  8:41 PM  Group Topic/Focus:  Wrap-Up Group:   The focus of this group is to help patients review their daily goal of treatment and discuss progress on daily workbooks.  Participation Level:  Active  Participation Quality:  Appropriate  Affect:  Appropriate  Cognitive:  Appropriate  Insight: Appropriate  Engagement in Group:  Engaged  Modes of Intervention:  Discussion and Support  Additional Comments:  Pt attended the evening group and responded to all discussion prompts from the Writer. Pt shared that today was a good day on the unit, the highlight of which was talking to her young daughter on the phone. On the subject of ways to stay well upon discharge, Pt mentioned wanting to get her own apartment, which will improve her overall quality of life. Pt rated her day a 5 out of 10.  Christ Kick 09/23/2022, 8:41 PM
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
## 2022-09-24 LAB — URINALYSIS, ROUTINE W REFLEX MICROSCOPIC
Bilirubin Urine: NEGATIVE
Glucose, UA: NEGATIVE mg/dL
Hgb urine dipstick: NEGATIVE
Ketones, ur: NEGATIVE mg/dL
Leukocytes,Ua: NEGATIVE
Nitrite: NEGATIVE
Protein, ur: NEGATIVE mg/dL
Specific Gravity, Urine: 1.005 (ref 1.005–1.030)
pH: 6 (ref 5.0–8.0)

## 2022-09-24 LAB — HEMOGLOBIN A1C
Hgb A1c MFr Bld: 5.6 % (ref 4.8–5.6)
Mean Plasma Glucose: 114 mg/dL

## 2022-09-24 LAB — TSH: TSH: 2.203 u[IU]/mL (ref 0.350–4.500)

## 2022-09-24 MED ORDER — RISPERIDONE 1 MG PO TABS: 1.0000 mg | ORAL_TABLET | Freq: Every day | ORAL | Status: DC

## 2022-09-24 MED ORDER — WHITE PETROLATUM EX OINT
TOPICAL_OINTMENT | CUTANEOUS | Status: AC
  Administered 2022-09-24: 1
  Filled 2022-09-24: qty 5

## 2022-09-24 MED ORDER — RISPERIDONE 2 MG PO TABS: 2.0000 mg | ORAL_TABLET | Freq: Every day | ORAL | Status: DC

## 2022-09-24 MED ORDER — RISPERIDONE 2 MG PO TABS
2.0000 mg | ORAL_TABLET | Freq: Every day | ORAL | Status: DC
  Administered 2022-09-25 – 2022-09-27 (×3): 2 mg via ORAL
  Filled 2022-09-24 (×4): qty 1

## 2022-09-24 MED ORDER — RISPERIDONE 1 MG PO TABS
1.0000 mg | ORAL_TABLET | Freq: Every day | ORAL | Status: AC
  Administered 2022-09-24: 1 mg via ORAL
  Filled 2022-09-24: qty 1

## 2022-09-24 NOTE — Progress Notes (Signed)
   09/24/22 1100  Psych Admission Type (Psych Patients Only)  Admission Status Involuntary  Psychosocial Assessment  Patient Complaints Anxiety  Eye Contact Fair  Facial Expression Sad;Worried  Affect Anxious;Depressed  Speech Soft  Interaction Assertive  Motor Activity Fidgety  Appearance/Hygiene Unremarkable  Behavior Characteristics Cooperative  Mood Anxious;Depressed  Thought Process  Coherency Circumstantial  Content Preoccupation  Delusions Paranoid  Perception WDL  Hallucination None reported or observed  Judgment Impaired  Confusion None  Danger to Self  Current suicidal ideation? Denies  Danger to Others  Danger to Others None reported or observed

## 2022-09-24 NOTE — Group Note (Signed)
Date:  09/24/2022 Time:  10:03 AM  Group Topic/Focus:  Goals Group:   The focus of this group is to help patients establish daily goals to achieve during treatment and discuss how the patient can incorporate goal setting into their daily lives to aide in recovery. Orientation:   The focus of this group is to educate the patient on the purpose and policies of crisis stabilization and provide a format to answer questions about their admission.  The group details unit policies and expectations of patients while admitted.    Participation Level:  Active  Participation Quality:  Appropriate  Affect:  Appropriate  Cognitive:  Appropriate  Insight: Appropriate  Engagement in Group:  Engaged  Modes of Intervention:  Discussion  Additional Comments:  Patient attended morning orientation/goal group and said that her goal for today is to stay positive.  Helmer Dull W Brendaliz Kuk 09/24/2022, 10:03 AM

## 2022-09-24 NOTE — Progress Notes (Signed)
   09/24/22 0600  15 Minute Checks  Location Bedroom  Visual Appearance Calm  Behavior Sleeping  Sleep (Behavioral Health Patients Only)  Calculate sleep? (Click Yes once per 24 hr at 0600 safety check) Yes  Documented sleep last 24 hours 12.5    

## 2022-09-24 NOTE — Progress Notes (Signed)
   09/24/22 2000  Psych Admission Type (Psych Patients Only)  Admission Status Involuntary  Psychosocial Assessment  Patient Complaints Anxiety  Eye Contact Fair  Facial Expression Anxious  Affect Depressed;Anxious  Speech Soft  Interaction Assertive  Motor Activity Fidgety  Appearance/Hygiene Unremarkable  Behavior Characteristics Cooperative  Mood Depressed;Anxious  Thought Process  Coherency Circumstantial  Content Preoccupation  Delusions Paranoid  Perception WDL  Hallucination None reported or observed  Judgment Impaired  Confusion None  Danger to Self  Current suicidal ideation? Denies  Danger to Others  Danger to Others None reported or observed

## 2022-09-24 NOTE — Plan of Care (Signed)
  Problem: Activity: Goal: Interest or engagement in activities will improve Outcome: Progressing   Problem: Coping: Goal: Ability to demonstrate self-control will improve Outcome: Progressing   

## 2022-09-24 NOTE — Progress Notes (Signed)
[facility] MD Progress Note  09/24/2022 4:26 PM Emma Wilcox  MRN:  [id number]  Reason for admission: 27 year old Caucasian female with prior hx of mental illness & substance use disorders. Admitted to the [facility] from the [facility] hospital under IVC with complained of worsening delusional thoughts, disorganized behavior, non-compliance to her medication regimen & feeling of paranoia. After medical evaluation/stabilization, patient was transferred to the [facility] for further psychiatric evaluation/treatments. A review of her toxicology reports has positive THC & cocaine. Per chart review, patient reported at the ED that the reason that she does not take her mental health medications is because, they make feel tired & loopy.   Daily notes: Emma Wilcox is seen. Chart reviewed. The chart findings discussed with the treatment team. She presents alert, oriented, more civil & level headed than yesterday during her admission evaluation. She is making a good eye contact, groomed She presents with a much improved affect, less labile, less pressured speech & more reasonable today. She reports, "I'm doing good. I'm just a little depressed because I miss my daughter. When am I going home? Please tell me when I'm going home. I need to go home to see my daughter. The morning medicine that I take is making me drowsy & sleepy. I slept well last night. My appetite is good. I am attending group sessions since being here". Emma Wilcox currently denies any any SIHI, AVH, delusional thoughts or paranoia. She does not appear to be responding to any internal stimuli. Emma Wilcox is instructed & encouraged to continue to take her medications as recommended. She is informed that her bid doses of her Risperdal 1 mg will be combined into 2 mg at bedtime to combat the morning drowsiness/sleepiness. She is informed & instructed that her treatment team are currently working on her medications to assure they treating her symptoms. She is encouraged to continue to attend group  sessions, take her medications as recommended & report any side effects promptly, if any. She is informed that her discharge date has not been set as of yet until we start to see improvement in her symptoms. Reviewed vital signs, stable. Will continue current plan of care as already in progress.  Principal Problem: Schizoaffective disorder, bipolar type (HCC)  Diagnosis: Principal Problem:   Schizoaffective disorder, bipolar type (HCC) Active Problems:   Cocaine use disorder, moderate, dependence (HCC)   Cannabis use disorder, severe, dependence (HCC)  Total Time spent with patient:  35 minutes  Past Psychiatric History: See H&P  Past Medical History:  Past Medical History:  Diagnosis Date   Medical history non-contributory     Past Surgical History:  Procedure Laterality Date   MOUTH SURGERY     Family History:  Family History  Problem Relation Age of Onset   Heart disease Mother    Healthy Father    Family Psychiatric  History: See H&P.  Social History:  Social History   Substance and Sexual Activity  Alcohol Use Yes     Social History   Substance and Sexual Activity  Drug Use Yes   Types: Marijuana    Social History   Socioeconomic History   Marital status: Single    Spouse name: Not on file   Number of children: Not on file   Years of education: Not on file   Highest education level: Not on file  Occupational History   Not on file  Tobacco Use   Smoking status: Every Day    Packs/day: 0.50    Years:  4.00    Additional pack years: 0.00    Total pack years: 2.00    Types: Cigarettes   Smokeless tobacco: Never  Substance and Sexual Activity   Alcohol use: Yes   Drug use: Yes    Types: Marijuana   Sexual activity: Yes    Birth control/protection: None  Other Topics Concern   Not on file  Social History Narrative   Not on file   Social Determinants of Health   Financial Resource Strain: Not on file  Food Insecurity: Patient Declined (09/22/2022)    Hunger Vital Sign    Worried About Running Out of Food in the Last Year: Patient declined    Ran Out of Food in the Last Year: Patient declined  Transportation Needs: No Transportation Needs (09/22/2022)   PRAPARE - Administrator, Civil Service (Medical): No    Lack of Transportation (Non-Medical): No  Physical Activity: Not on file  Stress: Not on file  Social Connections: Not on file   Additional Social History:   Sleep: Good  Appetite:  Good  Current Medications: Current Facility-Administered Medications  Medication Dose Route Frequency Provider Last Rate Last Admin   acetaminophen (TYLENOL) tablet 650 mg  650 mg Oral Q6H PRN Lenox Ponds, NP       alum & mag hydroxide-simeth (MAALOX/MYLANTA) 200-200-20 MG/5ML suspension 30 mL  30 mL Oral Q4H PRN Lenox Ponds, NP       diphenhydrAMINE (BENADRYL) capsule 50 mg  50 mg Oral TID PRN Lenox Ponds, NP       Or   diphenhydrAMINE (BENADRYL) injection 50 mg  50 mg Intramuscular TID PRN Lenox Ponds, NP       gabapentin (NEURONTIN) capsule 100 mg  100 mg Oral TID Armandina Stammer I, NP   100 mg at 09/24/22 1245   haloperidol (HALDOL) tablet 5 mg  5 mg Oral TID PRN Lenox Ponds, NP       Or   haloperidol lactate (HALDOL) injection 5 mg  5 mg Intramuscular TID PRN Lenox Ponds, NP       hydrOXYzine (ATARAX) tablet 25 mg  25 mg Oral Q4H PRN Armandina Stammer I, NP   25 mg at 09/23/22 2047   LORazepam (ATIVAN) tablet 2 mg  2 mg Oral TID PRN Lenox Ponds, NP       Or   LORazepam (ATIVAN) injection 2 mg  2 mg Intramuscular TID PRN Lenox Ponds, NP       magnesium hydroxide (MILK OF MAGNESIA) suspension 30 mL  30 mL Oral Daily PRN Lenox Ponds, NP       nicotine polacrilex (NICORETTE) gum 2 mg  2 mg Oral PRN Onuoha, Chinwendu V, NP   2 mg at 09/23/22 1618   risperiDONE (RISPERDAL) tablet 1 mg  1 mg Oral BID Armandina Stammer I, NP   1 mg at 09/24/22 0756   traZODone (DESYREL) tablet 50 mg  50 mg Oral QHS PRN  Lenox Ponds, NP   50 mg at 09/23/22 2047   Lab Results:  Results for orders placed or performed during the hospital encounter of 09/22/22 (from the past 48 hour(s))  Hemoglobin A1c     Status: None   Collection Time: 09/23/22  6:36 AM  Result Value Ref Range   Hgb A1c MFr Bld 5.6 4.8 - 5.6 %    Comment: (NOTE)         Prediabetes: 5.7 - 6.4  Diabetes: >6.4         Glycemic control for adults with diabetes: <7.0    Mean Plasma Glucose 114 mg/dL    Comment: (NOTE) Performed At: [facility] [place] [address] [place], [place] [id number] Jolene Schimke MD WU:[id number]   Lipid panel     Status: Abnormal   Collection Time: 09/23/22  6:36 AM  Result Value Ref Range   Cholesterol 146 0 - 200 mg/dL   Triglycerides 94 <956 mg/dL   HDL 39 (L) >21 mg/dL   Total CHOL/HDL Ratio 3.7 RATIO   VLDL 19 0 - 40 mg/dL   LDL Cholesterol 88 0 - 99 mg/dL    Comment:        Total Cholesterol/HDL:CHD Risk Coronary Heart Disease Risk Table                     Men   Women  1/2 Average Risk   3.4   3.3  Average Risk       5.0   4.4  2 X Average Risk   9.6   7.1  3 X Average Risk  23.4   11.0        Use the calculated Patient Ratio above and the CHD Risk Table to determine the patient's CHD Risk.        ATP III CLASSIFICATION (LDL):  <100     mg/dL   Optimal  308-657  mg/dL   Near or Above                    Optimal  130-159  mg/dL   Borderline  846-962  mg/dL   High  >952     mg/dL   Very High Performed at [facility], [address] [place], [place] [address]   Urinalysis, Routine w reflex microscopic -Urine, Clean Catch     Status: Abnormal   Collection Time: 09/23/22  7:59 AM  Result Value Ref Range   Color, Urine STRAW (A) YELLOW   APPearance CLEAR CLEAR   Specific Gravity, Urine 1.005 1.005 - 1.030   pH 6.0 5.0 - 8.0   Glucose, UA NEGATIVE NEGATIVE mg/dL   Hgb urine dipstick NEGATIVE NEGATIVE   Bilirubin Urine NEGATIVE NEGATIVE   Ketones, ur  NEGATIVE NEGATIVE mg/dL   Protein, ur NEGATIVE NEGATIVE mg/dL   Nitrite NEGATIVE NEGATIVE   Leukocytes,Ua NEGATIVE NEGATIVE    Comment: Performed at [facility], [address], [place], [place] [address]  TSH     Status: None   Collection Time: 09/24/22  6:45 AM  Result Value Ref Range   TSH 2.203 0.350 - 4.500 uIU/mL    Comment: Performed by a 3rd Generation assay with a functional sensitivity of <=0.01 uIU/mL. Performed at [facility] [address] [place], [place] [address]    Blood Alcohol level:  Lab Results  Component Value Date   [facility] <10 09/21/2022   ETH <10 11/16/2021   Metabolic Disorder Labs: Lab Results  Component Value Date   HGBA1C 5.6 09/23/2022   MPG 114 09/23/2022   No results found for: "PROLACTIN" Lab Results  Component Value Date   CHOL 146 09/23/2022   TRIG 94 09/23/2022   HDL 39 (L) 09/23/2022   CHOLHDL 3.7 09/23/2022   VLDL 19 09/23/2022   LDLCALC 88 09/23/2022   Physical Findings: AIMS: Facial and Oral Movements Muscles of Facial Expression: None, normal Lips and Perioral Area: None, normal Jaw:  None, normal Tongue: None, normal,Extremity Movements Upper (arms, wrists, hands, fingers): None, normal Lower (legs, knees, ankles, toes): None, normal, Trunk Movements Neck, shoulders, hips: None, normal, Overall Severity Severity of abnormal movements (highest score from questions above): None, normal Incapacitation due to abnormal movements: None, normal Patient's awareness of abnormal movements (rate only patient's report): No Awareness, Dental Status Current problems with teeth and/or dentures?: No Does patient usually wear dentures?: No  CIWA:    COWS:     Musculoskeletal: Strength & Muscle Tone: within normal limits Gait & Station: normal Patient leans: N/A  Psychiatric Specialty Exam:  Presentation  General Appearance:  Disheveled  Eye Contact: Good  Speech: Clear and Coherent;  Pressured  Speech Volume: Increased  Handedness: Right   Mood and Affect  Mood: Anxious; Labile  Affect: Congruent  Thought Process  Thought Processes: Disorganized  Descriptions of Associations:Intact  Orientation:Full (Time, Place and Person)  Thought Content:Tangential; Rumination  History of Schizophrenia/Schizoaffective disorder:Yes  Duration of Psychotic Symptoms:Greater than six months  Hallucinations:Hallucinations: None  Ideas of Reference:None  Suicidal Thoughts:Suicidal Thoughts: No  Homicidal Thoughts:Homicidal Thoughts: No  Sensorium  Memory: Immediate Good; Recent Good; Remote Fair  Judgment: Poor  Insight: Lacking  Executive Functions  Concentration: Poor  Attention Span: Poor  Recall:No data recorded Fund of Knowledge: Fair  Language: Good  Psychomotor Activity  Psychomotor Activity: Psychomotor Activity: Restlessness; Increased  Assets  Assets: Manufacturing systems engineer; Housing; Social Support; Physical Health  Sleep  Sleep: Sleep: Good Number of Hours of Sleep: 6.5  Physical Exam: Physical Exam Vitals and nursing note reviewed.  HENT:     Mouth/Throat:     Pharynx: Oropharynx is clear.  Eyes:     Pupils: Pupils are equal, round, and reactive to light.  Cardiovascular:     Rate and Rhythm: Normal rate.     Pulses: Normal pulses.  Pulmonary:     Effort: Pulmonary effort is normal.  Genitourinary:    Comments: Deferred Musculoskeletal:        General: Normal range of motion.     Cervical back: Normal range of motion.  Skin:    General: Skin is warm and dry.  Neurological:     General: No focal deficit present.     Mental Status: She is alert and oriented to person, place, and time.    Review of Systems  Constitutional:  Negative for chills, diaphoresis and fever.  HENT:  Negative for congestion and sore throat.   Eyes:  Negative for blurred vision.  Respiratory:  Negative for cough, shortness of breath  and wheezing.   Cardiovascular:  Negative for chest pain and palpitations.  Gastrointestinal:  Negative for abdominal pain, constipation, diarrhea, heartburn, nausea and vomiting.  Genitourinary:  Negative for dysuria.  Musculoskeletal:  Negative for joint pain and myalgias.  Neurological:  Negative for dizziness, tingling, tremors, sensory change, speech change, focal weakness and headaches.  Endo/Heme/Allergies:        NKDA  Psychiatric/Behavioral:  Positive for depression and substance abuse. Negative for hallucinations, memory loss and suicidal ideas. The patient is nervous/anxious. The patient does not have insomnia.    Blood pressure 94/61, pulse 85, temperature 98.6 F (37 C), temperature source Oral, resp. rate 16, height 5\' 3"  (1.6 m), weight 59.4 kg, SpO2 100 %. Body mass index is 23.21 kg/m.  Treatment Plan Summary: Daily contact with patient to assess and evaluate symptoms and progress in treatment and Medication management.   Continue inpatient hospitalization.  Will continue today 09/24/2022 plan as  below except where it is noted.   Principal/active diagnoses.  Schizoaffective disorder, bipolar type (HCC).   Plan: The risks/benefits/side-effects/alternatives to the medications in use were discussed in detail with the patient and time was given for patient's questions. The patient consents to medication trial.    Changed Risperdal 1 mg po bid to Risperdal 2 mg po Q hs for mood control (start tomorrow 09-25-22).  Give Risperdal 1 mg po q hs for mood control (tonight only 09-24-22). Continue gabapentin 100 mg po tid for agitation/anxiety. Continue hydroxyzine 25 mg po Q 4 hrs prn for anxiety.  Continue Trazodone 50 mg po Q bedtime prn for insomnia.  Continue Nicorette gum 2 mg po prn for nicotine withdrawal.    Agitation protocols: Cont as recommended;  -Benadryl 50 mg po or IM tid prn. -Haldol 5 mg po or IM tid prn.  -Lorazepam 2 mg po or IM tid prn.   Other  PRNS -Continue Tylenol 650 mg every 6 hours PRN for mild pain -Continue Maalox 30 ml Q 4 hrs PRN for indigestion -Continue MOM 30 ml po Q 6 hrs for constipation   Safety and Monitoring: Voluntary admission to inpatient psychiatric unit for safety, stabilization and treatment Daily contact with patient to assess and evaluate symptoms and progress in treatment Patient's case to be discussed in multi-disciplinary team meeting Observation Level : q15 minute checks Vital signs: q12 hours Precautions: Safety   Discharge Planning: Social work and case management to assist with discharge planning and identification of hospital follow-up needs prior to discharge Estimated LOS: 5-7 days Discharge Concerns: Need to establish a safety plan; Medication compliance and effectiveness Discharge Goals: Return home with outpatient referrals for mental health follow-up including medication management/psychotherapy  Armandina Stammer, NP, pmhnp, fnp-bc. 09/24/2022, 4:26 PM

## 2022-09-24 NOTE — Group Note (Signed)
Recreation Therapy Group Note   Group Topic:Team Building  Group Date: 09/24/2022 Start Time: 1003 End Time: 1030 Facilitators: Leotta Weingarten-McCall, LRT,CTRS Location: [address] [employer]   Recreation Therapy Notes  Goal Area(s) Addresses:  Patient will effectively work with peer towards shared goal.  Patient will identify skills used to make activity successful.  Patient will identify how skills used during activity can be applied to reach post d/c goals.   Group Description: [employer]. In teams of 5-6, patients were given 11 craft pipe cleaners. Using the materials provided, patients were instructed to compete again the opposing team(s) to build the tallest free-standing structure from floor level. The activity was timed; difficulty increased by Clinical research associate as Production designer, theatre/television/film continued.  Systematically resources were removed with additional directions for example, placing one arm behind their back, working in silence, and shape stipulations. LRT facilitated post-activity discussion reviewing team processes and necessary communication skills involved in completion. Patients were encouraged to reflect how the skills utilized, or not utilized, in this activity can be incorporated to positively impact support systems post discharge.   Affect/Mood: Appropriate   Participation Level: Active   Participation Quality: Independent   Behavior: Appropriate   Speech/Thought Process: Distracted and Focused   Insight: Moderate   Judgement: Moderate   Modes of Intervention: STEM Activity   Patient Response to Interventions:  Interested    Education Outcome:  Acknowledges education   Clinical Observations/Individualized Feedback: Pt appeared a little tired when group started.  Pt became more woke as group went on.  Pt seemed to take on the leader role with her group in coming up with a concept.  Eventually gave up when peer took the tower they had started a part.  Pt was also  focused on meeting with the doctor.  Pt was called out of group to meet with NP.     Plan: Continue to engage patient in RT group sessions 2-3x/week.   Janene Yousuf-McCall, LRT,CTRS 09/24/2022 12:09 PM

## 2022-09-24 NOTE — Progress Notes (Signed)
Patient asymptomatic. Fluids offered and accepted. Safety checks in place. Patient remains safe at this time.  09/24/22 0630  Vital Signs  Pulse Rate 85  BP 94/61  BP Location Right Arm  BP Method Automatic  Patient Position (if appropriate) Standing

## 2022-09-24 NOTE — Group Note (Signed)
Date:  09/24/2022 Time:  8:22 PM  Group Topic/Focus:  Wrap-Up Group:   The focus of this group is to help patients review their daily goal of treatment and discuss progress on daily workbooks.    Participation Level:  Did Not Attend   Scot Dock 09/24/2022, 8:22 PM

## 2022-09-25 DIAGNOSIS — F250 Schizoaffective disorder, bipolar type: Principal | ICD-10-CM

## 2022-09-25 LAB — HEMOGLOBIN A1C
Hgb A1c MFr Bld: 5.8 % — ABNORMAL HIGH (ref 4.8–5.6)
Mean Plasma Glucose: 120 mg/dL

## 2022-09-25 NOTE — Group Note (Signed)
Date:  09/25/2022 Time:  10:01 AM  Group Topic/Focus:  Goals Group:   The focus of this group is to help patients establish daily goals to achieve during treatment and discuss how the patient can incorporate goal setting into their daily lives to aide in recovery. Orientation:   The focus of this group is to educate the patient on the purpose and policies of crisis stabilization and provide a format to answer questions about their admission.  The group details unit policies and expectations of patients while admitted.    Participation Level:  Active  Participation Quality:  Appropriate  Affect:  Appropriate  Cognitive:  Appropriate  Insight: Appropriate  Engagement in Group:  Engaged  Modes of Intervention:  Discussion  Additional Comments:  Pt attended morning orientation/goal group and said her goal for today is to be positive.   Darianna Amy W Malvina Schadler 09/25/2022, 10:01 AM

## 2022-09-25 NOTE — Group Note (Signed)
Date:  09/25/2022 Time:  8:32 PM  Group Topic/Focus:  Wrap-Up Group:   The focus of this group is to help patients review their daily goal of treatment and discuss progress on daily workbooks.    Participation Level:  Did Not Attend  Participation Quality:  Appropriate  Affect:  Appropriate  Cognitive:  Appropriate  Insight: Appropriate  Engagement in Group:  Engaged  Modes of Intervention:  Exploration  Additional Comments:  Patient attended and participated in group tonight.  She reports that today her mom visited. She spoke with her children today. She id feeling much better today than yesterday.  Lita Mains [facility] 09/25/2022, 8:32 PM
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
## 2022-09-25 NOTE — Group Note (Signed)
Recreation Therapy Group Note   Group Topic:Goal Setting  Group Date: 09/25/2022 Start Time: 0955 End Time: 1020 Facilitators: Isaiah Torok-McCall, LRT,CTRS Location: [address]   Goal Area(s) Addresses:  Patient will participate in discussion of what a goal is. Patient will successfully identify goals and obstacles.  Group Description: DREAMS.  LRT and patients discussed what goals are and why they are important.  Patients were then given a worksheet with the word dream in large block letters.  Patients were to write their goals inside the letters such what they want to be or what they want out of life.  Patients would then write the obstacles on the outside of the letters and at the bottom of the paper, identify ways to overcome those challenges.    Affect/Mood: Anxious   Participation Level: Active   Participation Quality: Minimal Cues   Behavior: Distracted   Speech/Thought Process: Distracted   Insight: Moderate   Judgement: Moderate   Modes of Intervention: Worksheet   Patient Response to Interventions:  Engaged   Education Outcome:  Acknowledges education   Clinical Observations/Individualized Feedback: Pt was bright but was anxious about talking to doctor about when she would be discharged.  Pt needed multiple prompts to be patient and focus on group.  Pt was able to complete the activity.  Pt identified goals as wanting to be a wig dealer, making sure her kids "live their best life", take kids to [place], to see her kids and give them the world.  Pt identified her obstacle as her neighborhood.  Pt explained overcoming this obstacle by avoiding negative people, hang around people who want to see her do good, get rid of bad habits and eventually move.    Plan: Continue to engage patient in RT group sessions 2-3x/week.   Johncarlos Holtsclaw-McCall, LRT,CTRS 09/25/2022 10:23 AM

## 2022-09-25 NOTE — Progress Notes (Signed)
Pt compliant with medications and is participating in group therapies. Pt denies SI/HI/AVH. Pt smiling upon interaction. Pt presents labile as evidenced by pt noted to be laughing and smiling and a short time later to be crying. Q 15 minute checks ongoing.

## 2022-09-25 NOTE — B[facility] Group Notes (Signed)
Spirituality group facilitated by Kathleen Argue, BCC.  Group Description: Group focused on topic of hope. Patients participated in facilitated discussion around topic, connecting with one another around experiences and definitions for hope. Group members engaged with visual explorer photos, reflecting on what hope looks like for them today. Group engaged in discussion around how their definitions of hope are present today in hospital.  Modalities: Psycho-social ed, Adlerian, Narrative, MI  Patient Progress: Emma Wilcox attended group and actively engaged and participated in group conversation.  Her kids give her hope and she wants to be a good mom for them.  She also had a very special relationship with her grandma, Emma Wilcox.  Thinking about her grandmother gives her hope as well.

## 2022-09-25 NOTE — Progress Notes (Signed)
   09/25/22 2000  Psych Admission Type (Psych Patients Only)  Admission Status Involuntary  Psychosocial Assessment  Patient Complaints Worrying  Eye Contact Brief  Facial Expression Animated;Worried  Affect Appropriate to circumstance  Furniture conservator/restorer  Appearance/Hygiene Unremarkable  Behavior Characteristics Cooperative;Appropriate to situation  Mood Pleasant  Thought Process  Coherency Circumstantial  Content Blaming others;Preoccupation  Delusions None reported or observed  Perception WDL  Hallucination None reported or observed  Judgment Poor  Confusion None  Danger to Self  Current suicidal ideation? Denies

## 2022-09-25 NOTE — Progress Notes (Signed)
[facility] MD Progress Note  09/25/2022 3:21 PM Emma Wilcox  MRN:  [id number]  Reason for admission: 27 year old Caucasian female with prior hx of mental illness & substance use disorders. Admitted to the [facility] from the [facility] hospital under IVC with complained of worsening delusional thoughts, disorganized behavior, non-compliance to her medication regimen & feeling of paranoia. After medical evaluation/stabilization, patient was transferred to the [facility] for further psychiatric evaluation/treatments. A review of her toxicology reports has positive THC & cocaine. Per chart review, patient reported at the ED that the reason that she does not take her mental health medications is because, they make feel tired & loopy.   Daily notes: Emma Wilcox is seen. Chart reviewed. The chart findings discussed with the treatment team. She presents alert, oriented, civil & showing some improvement in her mood, behavior/mannerisms. She is making a good eye contact & fairly groomed. However, she presents with some hypomanic symptoms today, mood lability (cries/laughs inter-changeably) about the same time, overly emotional when talking about her daughter. However, she is complaint with her recommended treatment regimen. Says she is tolerating her treatment regimen. Denies any side effects. She is actively participating in the group milieu. Staff reports that she is cooperative. There are no behavioral problems reported by staff. Patient says she slept well last night. She currently denies any SIHI, AVH, delusional thoughts or paranoia. She does not appear to be responding to any internal stimuli. There are no changes made on patient's current plan of care today, continue as already in progress. Vital signs has shown elevated pulse rate (116). Patient is in no apparent distress. Staff will recheck.  Principal Problem: Schizoaffective disorder, bipolar type (HCC)  Diagnosis: Principal Problem:   Schizoaffective disorder, bipolar type  (HCC) Active Problems:   Cocaine use disorder, moderate, dependence (HCC)   Cannabis use disorder, severe, dependence (HCC)  Total Time spent with patient:  35 minutes  Past Psychiatric History: See H&P  Past Medical History:  Past Medical History:  Diagnosis Date   Medical history non-contributory     Past Surgical History:  Procedure Laterality Date   MOUTH SURGERY     Family History:  Family History  Problem Relation Age of Onset   Heart disease Mother    Healthy Father    Family Psychiatric  History: See H&P.  Social History:  Social History   Substance and Sexual Activity  Alcohol Use Yes     Social History   Substance and Sexual Activity  Drug Use Yes   Types: Marijuana    Social History   Socioeconomic History   Marital status: Single    Spouse name: Not on file   Number of children: Not on file   Years of education: Not on file   Highest education level: Not on file  Occupational History   Not on file  Tobacco Use   Smoking status: Every Day    Packs/day: 0.50    Years: 4.00    Additional pack years: 0.00    Total pack years: 2.00    Types: Cigarettes   Smokeless tobacco: Never  Substance and Sexual Activity   Alcohol use: Yes   Drug use: Yes    Types: Marijuana   Sexual activity: Yes    Birth control/protection: None  Other Topics Concern   Not on file  Social History Narrative   Not on file   Social Determinants of Health   Financial Resource Strain: Not on file  Food Insecurity: Patient Declined (09/22/2022)  Hunger Vital Sign    Worried About Running Out of Food in the Last Year: Patient declined    Ran Out of Food in the Last Year: Patient declined  Transportation Needs: No Transportation Needs (09/22/2022)   PRAPARE - Administrator, Civil Service (Medical): No    Lack of Transportation (Non-Medical): No  Physical Activity: Not on file  Stress: Not on file  Social Connections: Not on file   Additional Social  History:   Sleep: Good  Appetite:  Good  Current Medications: Current Facility-Administered Medications  Medication Dose Route Frequency Provider Last Rate Last Admin   acetaminophen (TYLENOL) tablet 650 mg  650 mg Oral Q6H PRN Lenox Ponds, NP       alum & mag hydroxide-simeth (MAALOX/MYLANTA) 200-200-20 MG/5ML suspension 30 mL  30 mL Oral Q4H PRN Lenox Ponds, NP       diphenhydrAMINE (BENADRYL) capsule 50 mg  50 mg Oral TID PRN Lenox Ponds, NP       Or   diphenhydrAMINE (BENADRYL) injection 50 mg  50 mg Intramuscular TID PRN Lenox Ponds, NP       gabapentin (NEURONTIN) capsule 100 mg  100 mg Oral TID Armandina Stammer I, NP   100 mg at 09/25/22 1205   haloperidol (HALDOL) tablet 5 mg  5 mg Oral TID PRN Lenox Ponds, NP       Or   haloperidol lactate (HALDOL) injection 5 mg  5 mg Intramuscular TID PRN Lenox Ponds, NP       hydrOXYzine (ATARAX) tablet 25 mg  25 mg Oral Q4H PRN Armandina Stammer I, NP   25 mg at 09/24/22 2037   LORazepam (ATIVAN) tablet 2 mg  2 mg Oral TID PRN Lenox Ponds, NP       Or   LORazepam (ATIVAN) injection 2 mg  2 mg Intramuscular TID PRN Lenox Ponds, NP       magnesium hydroxide (MILK OF MAGNESIA) suspension 30 mL  30 mL Oral Daily PRN Lenox Ponds, NP       nicotine polacrilex (NICORETTE) gum 2 mg  2 mg Oral PRN Onuoha, Chinwendu V, NP   2 mg at 09/23/22 1618   risperiDONE (RISPERDAL) tablet 2 mg  2 mg Oral QHS Talan Gildner I, NP       traZODone (DESYREL) tablet 50 mg  50 mg Oral QHS PRN Lenox Ponds, NP   50 mg at 09/24/22 2036   Lab Results:  Results for orders placed or performed during the hospital encounter of 09/22/22 (from the past 48 hour(s))  Hemoglobin A1c     Status: Abnormal   Collection Time: 09/24/22  6:45 AM  Result Value Ref Range   Hgb A1c MFr Bld 5.8 (H) 4.8 - 5.6 %    Comment: (NOTE)         Prediabetes: 5.7 - 6.4         Diabetes: >6.4         Glycemic control for adults with diabetes: <7.0     Mean Plasma Glucose 120 mg/dL    Comment: (NOTE) Performed At: [facility] [address] [place], [place] [id number] Jolene Schimke MD WG:[id number]   TSH     Status: None   Collection Time: 09/24/22  6:45 AM  Result Value Ref Range   TSH 2.203 0.350 - 4.500 uIU/mL    Comment: Performed by a 3rd Generation assay with a functional sensitivity of <=  0.01 uIU/mL. Performed at [facility], [address] [place], [place] [address]    Blood Alcohol level:  Lab Results  Component Value Date   [facility] <10 09/21/2022   ETH <10 11/16/2021   Metabolic Disorder Labs: Lab Results  Component Value Date   HGBA1C 5.8 (H) 09/24/2022   MPG 120 09/24/2022   MPG 114 09/23/2022   No results found for: "PROLACTIN" Lab Results  Component Value Date   CHOL 146 09/23/2022   TRIG 94 09/23/2022   HDL 39 (L) 09/23/2022   CHOLHDL 3.7 09/23/2022   VLDL 19 09/23/2022   LDLCALC 88 09/23/2022   Physical Findings: AIMS: Facial and Oral Movements Muscles of Facial Expression: None, normal Lips and Perioral Area: None, normal Jaw: None, normal Tongue: None, normal,Extremity Movements Upper (arms, wrists, hands, fingers): None, normal Lower (legs, knees, ankles, toes): None, normal, Trunk Movements Neck, shoulders, hips: None, normal, Overall Severity Severity of abnormal movements (highest score from questions above): None, normal Incapacitation due to abnormal movements: None, normal Patient's awareness of abnormal movements (rate only patient's report): No Awareness, Dental Status Current problems with teeth and/or dentures?: No Does patient usually wear dentures?: No  CIWA:    COWS:     Musculoskeletal: Strength & Muscle Tone: within normal limits Gait & Station: normal Patient leans: N/A  Psychiatric Specialty Exam:  Presentation  General Appearance:  Disheveled  Eye Contact: Good  Speech: Clear and Coherent; Pressured  Speech  Volume: Increased  Handedness: Right   Mood and Affect  Mood: Anxious; Labile  Affect: Congruent  Thought Process  Thought Processes: Disorganized  Descriptions of Associations:Intact  Orientation:Full (Time, Place and Person)  Thought Content:Tangential; Rumination  History of Schizophrenia/Schizoaffective disorder:Yes  Duration of Psychotic Symptoms:Greater than six months  Hallucinations:No data recorded  Ideas of Reference:None  Suicidal Thoughts:No data recorded  Homicidal Thoughts:No data recorded  Sensorium  Memory: Immediate Good; Recent Good; Remote Fair  Judgment: Poor  Insight: Lacking  Executive Functions  Concentration: Poor  Attention Span: Poor  Recall:No data recorded Fund of Knowledge: Fair  Language: Good  Psychomotor Activity  Psychomotor Activity: No data recorded  Assets  Assets: Communication Skills; Housing; Social Support; Physical Health  Sleep  Sleep: No data recorded  Physical Exam: Physical Exam Vitals and nursing note reviewed.  HENT:     Mouth/Throat:     Pharynx: Oropharynx is clear.  Eyes:     Pupils: Pupils are equal, round, and reactive to light.  Cardiovascular:     Rate and Rhythm: Normal rate.     Pulses: Normal pulses.  Pulmonary:     Effort: Pulmonary effort is normal.  Genitourinary:    Comments: Deferred Musculoskeletal:        General: Normal range of motion.     Cervical back: Normal range of motion.  Skin:    General: Skin is warm and dry.  Neurological:     General: No focal deficit present.     Mental Status: She is alert and oriented to person, place, and time.    Review of Systems  Constitutional:  Negative for chills, diaphoresis and fever.  HENT:  Negative for congestion and sore throat.   Eyes:  Negative for blurred vision.  Respiratory:  Negative for cough, shortness of breath and wheezing.   Cardiovascular:  Negative for chest pain and palpitations.   Gastrointestinal:  Negative for abdominal pain, constipation, diarrhea, heartburn, nausea and vomiting.  Genitourinary:  Negative for dysuria.  Musculoskeletal:  Negative  for joint pain and myalgias.  Neurological:  Negative for dizziness, tingling, tremors, sensory change, speech change, focal weakness and headaches.  Endo/Heme/Allergies:        NKDA  Psychiatric/Behavioral:  Positive for depression and substance abuse. Negative for hallucinations, memory loss and suicidal ideas. The patient is nervous/anxious. The patient does not have insomnia.    Blood pressure 125/83, pulse (!) 110, temperature 99.2 F (37.3 C), temperature source Oral, resp. rate 16, height 5\' 3"  (1.6 m), weight 59.4 kg, SpO2 100 %. Body mass index is 23.21 kg/m.  Treatment Plan Summary: Daily contact with patient to assess and evaluate symptoms and progress in treatment and Medication management.   Continue inpatient hospitalization.  Will continue today 09/25/2022 plan as below except where it is noted.   Principal/active diagnoses.  Schizoaffective disorder, bipolar type (HCC).   Plan: The risks/benefits/side-effects/alternatives to the medications in use were discussed in detail with the patient and time was given for patient's questions. The patient consents to medication trial.    Changed Risperdal 1 mg po bid to Risperdal 2 mg po Q hs for mood control (start tomorrow 09-25-22).  Completed Risperdal 1 mg po q hs for mood control (tonight only 09-24-22). Continue gabapentin 100 mg po tid for agitation/anxiety. Continue hydroxyzine 25 mg po Q 4 hrs prn for anxiety.  Continue Trazodone 50 mg po Q bedtime prn for insomnia.  Continue Nicorette gum 2 mg po prn for nicotine withdrawal.    Agitation protocols: Cont as recommended;  -Benadryl 50 mg po or IM tid prn. -Haldol 5 mg po or IM tid prn.  -Lorazepam 2 mg po or IM tid prn.   Other PRNS -Continue Tylenol 650 mg every 6 hours PRN for mild pain -Continue  Maalox 30 ml Q 4 hrs PRN for indigestion -Continue MOM 30 ml po Q 6 hrs for constipation   Safety and Monitoring: Voluntary admission to inpatient psychiatric unit for safety, stabilization and treatment Daily contact with patient to assess and evaluate symptoms and progress in treatment Patient's case to be discussed in multi-disciplinary team meeting Observation Level : q15 minute checks Vital signs: q12 hours Precautions: Safety   Discharge Planning: Social work and case management to assist with discharge planning and identification of hospital follow-up needs prior to discharge Estimated LOS: 5-7 days Discharge Concerns: Need to establish a safety plan; Medication compliance and effectiveness Discharge Goals: Return home with outpatient referrals for mental health follow-up including medication management/psychotherapy  Armandina Stammer, NP, pmhnp, fnp-bc. 09/25/2022, 3:21 PMPatient ID: Emma Wilcox, female   DOB: [birth year], 27 y.o.   MRN: [id number]

## 2022-09-25 NOTE — Progress Notes (Signed)
[facility] LCSW Progress Note   09/25/2022    2:41 PM  Carmesha Morocco   [phone]   Type of Contact and Topic:  homeless packet  CSW provided resources for people experiencing homelessness     Signed:  Anson [place] MSW, LCSW, LCAS 09/25/2022 2:41 PM

## 2022-09-25 NOTE — Group Note (Signed)
Date:  09/25/2022 Time:  1:34 PM  Group Topic/Focus:  Self Care:   The focus of this group is to help patients understand the importance of self-care in order to improve or restore emotional, physical, spiritual, interpersonal, and financial health.    Participation Level:  Active  Participation Quality:  Appropriate  Affect:  Appropriate  Cognitive:  Appropriate  Insight: Appropriate  Engagement in Group:  Engaged  Modes of Intervention:  Discussion  Additional Comments:  Patient attended Healthy Support group and participated.  Taressa Rauh W Cailah Reach 09/25/2022, 1:34 PM

## 2022-09-25 NOTE — Progress Notes (Signed)
   09/25/22 0536  15 Minute Checks  Location Bedroom  Visual Appearance Calm  Behavior Composed  Sleep (Behavioral Health Patients Only)  Calculate sleep? (Click Yes once per 24 hr at 0600 safety check) Yes  Documented sleep last 24 hours 5.25

## 2022-09-26 NOTE — Progress Notes (Signed)
   09/26/22 1400  Psych Admission Type (Psych Patients Only)  Admission Status Involuntary  Psychosocial Assessment  Patient Complaints Worrying  Eye Contact Fair  Facial Expression Animated  Affect Appropriate to circumstance  Speech Logical/coherent  Interaction Assertive  Motor Activity Fidgety  Appearance/Hygiene Unremarkable  Behavior Characteristics Cooperative  Mood Pleasant  Thought Process  Coherency Circumstantial  Content Preoccupation  Delusions None reported or observed  Perception WDL  Hallucination None reported or observed  Judgment Impaired  Confusion None  Danger to Self  Current suicidal ideation? Denies  Danger to Others  Danger to Others None reported or observed
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
## 2022-09-26 NOTE — Progress Notes (Signed)
   09/26/22 0553  15 Minute Checks  Location Bedroom  Visual Appearance Calm  Behavior Sleeping  Sleep (Behavioral Health Patients Only)  Calculate sleep? (Click Yes once per 24 hr at 0600 safety check) Yes  Documented sleep last 24 hours 9

## 2022-09-26 NOTE — [facility] Group Notes (Signed)
Adult Psychoeducational Group Note  Date:  09/26/2022 Time:  4:09 PM  Group Topic/Focus:  Goals Group:   The focus of this group is to help patients establish daily goals to achieve during treatment and discuss how the patient can incorporate goal setting into their daily lives to aide in recovery. Orientation:   The focus of this group is to educate the patient on the purpose and policies of crisis stabilization and provide a format to answer questions about their admission.  The group details unit policies and expectations of patients while admitted.  Participation Level:  Active  Participation Quality:  Appropriate  Affect:  Appropriate  Cognitive:  Appropriate  Insight: Appropriate  Engagement in Group:  Engaged  Modes of Intervention:  Discussion  Additional Comments:  Pt attended the goals group and remained appropriate and engaged throughout the duration of the group.   Sheran Lawless 09/26/2022, 4:09 PM

## 2022-09-26 NOTE — Progress Notes (Signed)
Patient has been up and active on the unit, attended group this evening and has voiced no complaints. Patient currently denies having pain, -si/hi/a/v hall. Support and encouragement offered, safety maintained on unit, will continue to monitor.   09/26/22 2013  Psych Admission Type (Psych Patients Only)  Admission Status Involuntary  Psychosocial Assessment  Patient Complaints None  Eye Contact Brief  Facial Expression Animated  Affect Appropriate to circumstance  Speech Logical/coherent  Interaction Assertive  Motor Activity Other (Comment) (wnl)  Appearance/Hygiene Unremarkable  Behavior Characteristics Cooperative;Appropriate to situation;Calm  Mood Pleasant  Thought Process  Coherency Circumstantial  Content WDL  Delusions None reported or observed  Perception WDL  Hallucination None reported or observed  Judgment Poor  Confusion None  Danger to Self  Current suicidal ideation? Denies

## 2022-09-26 NOTE — Group Note (Signed)
Date:  09/26/2022 Time:  8:34 PM  Group Topic/Focus:  Wrap-Up Group:   The focus of this group is to help patients review their daily goal of treatment and discuss progress on daily workbooks.    Participation Level:  Active  Participation Quality:  Appropriate  Affect:  Appropriate  Cognitive:  Appropriate  Insight: Appropriate  Engagement in Group:  Engaged  Modes of Intervention:  Education  Additional Comments:  Patient attended and participated in group last night. She reports that today she talked to her children and that made her day. She was told today that she will go home on Monday. Someone told her today that they were proud of her. No one has told her that in a very long tim. She felt good.  Lita Mains [facility] 09/26/2022, 8:34 PM

## 2022-09-27 MED ORDER — IBUPROFEN 400 MG PO TABS
400.0000 mg | ORAL_TABLET | Freq: Four times a day (QID) | ORAL | Status: DC | PRN
  Administered 2022-09-27 – 2022-09-28 (×3): 400 mg via ORAL
  Filled 2022-09-27 (×3): qty 1

## 2022-09-27 NOTE — Group Note (Unsigned)
Date:  09/27/2022 Time:  9:07 PM  Group Topic/Focus:  Wrap-Up Group:   The focus of this group is to help patients review their daily goal of treatment and discuss progress on daily workbooks.     Participation Level:  {[facility] PARTICIPATION ONGEX:52841}  Participation Quality:  {[facility] PARTICIPATION QUALITY:22265}  Affect:  {[facility] AFFECT:22266}  Cognitive:  {[facility] COGNITIVE:22267}  Insight: {B[facility] Insight2:20797}  Engagement in Group:  {B[facility] ENGAGEMENT IN LKGMW:10272}  Modes of Intervention:  {B[facility] MODES OF INTERVENTION:22269}  Additional Comments:  ***  Scot Dock 09/27/2022, 9:07 PM

## 2022-09-27 NOTE — Progress Notes (Signed)
[facility] MD Progress Note  09/27/2022 8:57 AM Emma Wilcox  MRN:  [id number]  Reason for admission: 27 year old Caucasian female with prior hx of mental illness & substance use disorders. Admitted to the [facility] from the [facility] hospital under IVC with complained of worsening delusional thoughts, disorganized behavior, non-compliance to her medication regimen & feeling of paranoia. After medical evaluation/stabilization, patient was transferred to the [facility] for further psychiatric evaluation/treatments. A review of her toxicology reports has positive THC & cocaine. Per chart review, patient reported at the ED that the reason that she does not take her mental health medications is because, they make feel tired & loopy.   24-hour chart Review: Past 24 hours of patient's chart was reviewed.  Patient is compliant with scheduled meds. Required Agitation PRNs: none As needed medications: Trazodone given at 2046 last night for insomnia, nicotine gum given at 1813, 1703 today for smoking cessation, ibuprofen 400 mg p.o. given at 1255 for menstrual period cramping, hydroxyzine given at 0024 and 0800 for anxiety, and Tylenol given at 0605 today for menstrual pain. Per RN notes, no documented behavioral issues and is attending group. Patient slept,  hours.    Daily notes: Emma Wilcox is seen and examined in her room and [address]. Chart reviewed and findings shared shared with the treatment team and discussed with the attending psychiatrist.  She presents alert, pleasant and oriented, person, place, & situation.  Complaints of menstrual period cramping, ibuprofen 400 mg p.o. every 6 hours as needed initiated with some relief.  She is making a good eye contact & fairly groomed.  She  reports complaint with her recommended treatment regimen and denies any side effects. She is actively participating in the group milieu. Staff reports that she is cooperative. There are no behavioral problems reported by staff. Patient says she slept well  last night up to 9 hours. She currently denies any SIHI, AVH, delusional thoughts or paranoia. She does not appear to be responding to any internal stimuli. There are no changes made on patient's current plan of care today, continue as already in progress. Vital signs within normal limit with pulse of 104.  Expected date of discharge 09/28/2022.  Principal Problem: Schizoaffective disorder, bipolar type (HCC)  Diagnosis: Principal Problem:   Schizoaffective disorder, bipolar type (HCC) Active Problems:   Cocaine use disorder, moderate, dependence (HCC)   Cannabis use disorder, severe, dependence (HCC)  Total Time spent with patient:  35 minutes  Past Psychiatric History: See H&P  Past Medical History:  Past Medical History:  Diagnosis Date   Medical history non-contributory     Past Surgical History:  Procedure Laterality Date   MOUTH SURGERY     Family History:  Family History  Problem Relation Age of Onset   Heart disease Mother    Healthy Father    Family Psychiatric  History: See H&P.  Social History:  Social History   Substance and Sexual Activity  Alcohol Use Yes     Social History   Substance and Sexual Activity  Drug Use Yes   Types: Marijuana    Social History   Socioeconomic History   Marital status: Single    Spouse name: Not on file   Number of children: Not on file   Years of education: Not on file   Highest education level: Not on file  Occupational History   Not on file  Tobacco Use   Smoking status: Every Day    Packs/day: 0.50  Years: 4.00    Additional pack years: 0.00    Total pack years: 2.00    Types: Cigarettes   Smokeless tobacco: Never  Substance and Sexual Activity   Alcohol use: Yes   Drug use: Yes    Types: Marijuana   Sexual activity: Yes    Birth control/protection: None  Other Topics Concern   Not on file  Social History Narrative   Not on file   Social Determinants of Health   Financial Resource Strain: Not on  file  Food Insecurity: Patient Declined (09/22/2022)   Hunger Vital Sign    Worried About Running Out of Food in the Last Year: Patient declined    Ran Out of Food in the Last Year: Patient declined  Transportation Needs: No Transportation Needs (09/22/2022)   PRAPARE - Administrator, Civil Service (Medical): No    Lack of Transportation (Non-Medical): No  Physical Activity: Not on file  Stress: Not on file  Social Connections: Not on file   Additional Social History:   Sleep: Good  Appetite:  Good  Current Medications: Current Facility-Administered Medications  Medication Dose Route Frequency Provider Last Rate Last Admin   acetaminophen (TYLENOL) tablet 650 mg  650 mg Oral Q6H PRN Lenox Ponds, NP   650 mg at 09/27/22 0605   alum & mag hydroxide-simeth (MAALOX/MYLANTA) 200-200-20 MG/5ML suspension 30 mL  30 mL Oral Q4H PRN Lenox Ponds, NP       diphenhydrAMINE (BENADRYL) capsule 50 mg  50 mg Oral TID PRN Lenox Ponds, NP       Or   diphenhydrAMINE (BENADRYL) injection 50 mg  50 mg Intramuscular TID PRN Lenox Ponds, NP       gabapentin (NEURONTIN) capsule 100 mg  100 mg Oral TID Armandina Stammer I, NP   100 mg at 09/27/22 0759   haloperidol (HALDOL) tablet 5 mg  5 mg Oral TID PRN Lenox Ponds, NP       Or   haloperidol lactate (HALDOL) injection 5 mg  5 mg Intramuscular TID PRN Lenox Ponds, NP       hydrOXYzine (ATARAX) tablet 25 mg  25 mg Oral Q4H PRN Armandina Stammer I, NP   25 mg at 09/27/22 0800   ibuprofen (ADVIL) tablet 400 mg  400 mg Oral Q6H PRN Jacaden Forbush, Jesusita Oka, FNP       LORazepam (ATIVAN) tablet 2 mg  2 mg Oral TID PRN Lenox Ponds, NP       Or   LORazepam (ATIVAN) injection 2 mg  2 mg Intramuscular TID PRN Lenox Ponds, NP       magnesium hydroxide (MILK OF MAGNESIA) suspension 30 mL  30 mL Oral Daily PRN Lenox Ponds, NP       nicotine polacrilex (NICORETTE) gum 2 mg  2 mg Oral PRN Onuoha, Chinwendu V, NP   2 mg at 09/23/22 1618    risperiDONE (RISPERDAL) tablet 2 mg  2 mg Oral QHS Nwoko, Nicole Kindred I, NP   2 mg at 09/26/22 2046   traZODone (DESYREL) tablet 50 mg  50 mg Oral QHS PRN Lenox Ponds, NP   50 mg at 09/26/22 2046   Lab Results:  No results found for this or any previous visit (from the past 48 hour(s)).  Blood Alcohol level:  Lab Results  Component Value Date   [facility] <10 09/21/2022   ETH <10 11/16/2021   Metabolic Disorder Labs: Lab Results  Component Value Date   HGBA1C 5.8 (H) 09/24/2022   MPG 120 09/24/2022   MPG 114 09/23/2022   No results found for: "PROLACTIN" Lab Results  Component Value Date   CHOL 146 09/23/2022   TRIG 94 09/23/2022   HDL 39 (L) 09/23/2022   CHOLHDL 3.7 09/23/2022   VLDL 19 09/23/2022   LDLCALC 88 09/23/2022   Physical Findings: AIMS: Facial and Oral Movements Muscles of Facial Expression: None, normal Lips and Perioral Area: None, normal Jaw: None, normal Tongue: None, normal,Extremity Movements Upper (arms, wrists, hands, fingers): None, normal Lower (legs, knees, ankles, toes): None, normal, Trunk Movements Neck, shoulders, hips: None, normal, Overall Severity Severity of abnormal movements (highest score from questions above): None, normal Incapacitation due to abnormal movements: None, normal Patient's awareness of abnormal movements (rate only patient's report): No Awareness, Dental Status Current problems with teeth and/or dentures?: No Does patient usually wear dentures?: No  CIWA:    COWS:     Musculoskeletal: Strength & Muscle Tone: within normal limits Gait & Station: normal Patient leans: N/A  Psychiatric Specialty Exam:  Presentation  General Appearance:  Appropriate for Environment; Casual; Fairly Groomed  Eye Contact: Good  Speech: Clear and Coherent; Normal Rate  Speech Volume: Normal  Handedness: Right  Mood and Affect  Mood: Anxious; Depressed  Affect: Appropriate; Congruent  Thought Process  Thought  Processes: Coherent; Linear  Descriptions of Associations:Intact  Orientation:Full (Time, Place and Person)  Thought Content:Logical  History of Schizophrenia/Schizoaffective disorder:Yes  Duration of Psychotic Symptoms:Greater than six months  Hallucinations:Hallucinations: None  Ideas of Reference:None  Suicidal Thoughts:Suicidal Thoughts: No   Homicidal Thoughts:Homicidal Thoughts: No  Sensorium  Memory: Immediate Good; Recent Good  Judgment: Fair  Insight: Fair  Art therapist  Concentration: Good  Attention Span: Good  Recall:Fair  Fund of Knowledge: Fair  Language: Good  Psychomotor Activity  Psychomotor Activity: Psychomotor Activity: Normal (Calm but intrusive)  Assets  Assets: Communication Skills; Desire for Improvement; Housing; Physical Health; Resilience; Social Support  Sleep  Sleep: Sleep: Fair Number of Hours of Sleep: 4  Physical Exam: Physical Exam Vitals and nursing note reviewed.  HENT:     Mouth/Throat:     Pharynx: Oropharynx is clear.  Eyes:     Pupils: Pupils are equal, round, and reactive to light.  Cardiovascular:     Rate and Rhythm: Normal rate.     Pulses: Normal pulses.  Pulmonary:     Effort: Pulmonary effort is normal.  Genitourinary:    Comments: Deferred Musculoskeletal:        General: Normal range of motion.     Cervical back: Normal range of motion.  Skin:    General: Skin is warm and dry.  Neurological:     General: No focal deficit present.     Mental Status: She is alert and oriented to person, place, and time.  Psychiatric:        Mood and Affect: Mood normal.        Behavior: Behavior normal.    Review of Systems  Constitutional:  Negative for chills, diaphoresis and fever.  HENT:  Negative for congestion and sore throat.   Eyes:  Negative for blurred vision.  Respiratory:  Negative for cough, shortness of breath and wheezing.   Cardiovascular:  Negative for chest pain and  palpitations.  Gastrointestinal:  Negative for abdominal pain, constipation, diarrhea, heartburn, nausea and vomiting.  Genitourinary:  Negative for dysuria.  Musculoskeletal:  Negative for joint pain and myalgias.  Neurological:  Negative for dizziness, tingling, tremors, sensory change, speech change, focal weakness and headaches.  Endo/Heme/Allergies:        NKDA  Psychiatric/Behavioral:  Positive for depression and substance abuse. Negative for hallucinations, memory loss and suicidal ideas. The patient is nervous/anxious. The patient does not have insomnia.    Blood pressure 118/78, pulse (!) 104, temperature 98.1 F (36.7 C), temperature source Oral, resp. rate 16, height 5\' 3"  (1.6 m), weight 59.4 kg, SpO2 100 %. Body mass index is 23.21 kg/m.  Treatment Plan Summary: Daily contact with patient to assess and evaluate symptoms and progress in treatment and Medication management.   Continue inpatient hospitalization.  Will continue today 09/27/2022 plan as below except where it is noted.   Principal/active diagnoses.  Schizoaffective disorder, bipolar type (HCC).   Plan: The risks/benefits/side-effects/alternatives to the medications in use were discussed in detail with the patient and time was given for patient's questions. The patient consents to medication trial.    Continue Risperdal 2 mg po Q hs for mood control (start tomorrow 09-25-22).  Completed Risperdal 1 mg po q hs for mood control (tonight only 09-24-22). Continue gabapentin 100 mg po tid for agitation/anxiety. Continue hydroxyzine 25 mg po Q 4 hrs prn for anxiety.  Continue Trazodone 50 mg po Q bedtime prn for insomnia.  Continue Nicorette gum 2 mg po prn for nicotine withdrawal.    Agitation protocols: Cont as recommended;  -Benadryl 50 mg po or IM tid prn. -Haldol 5 mg po or IM tid prn.  -Lorazepam 2 mg po or IM tid prn.   Other PRNS -Continue Tylenol 650 mg every 6 hours PRN for mild pain -Continue Maalox 30  ml Q 4 hrs PRN for indigestion -Continue MOM 30 ml po Q 6 hrs for constipation -Initiate Ibuprofen 400 mg po prn Q 6 hours for menstrual cramping   Safety and Monitoring: Voluntary admission to inpatient psychiatric unit for safety, stabilization and treatment Daily contact with patient to assess and evaluate symptoms and progress in treatment Patient's case to be discussed in multi-disciplinary team meeting Observation Level : q15 minute checks Vital signs: q12 hours Precautions: Safety   Discharge Planning: Social work and case management to assist with discharge planning and identification of hospital follow-up needs prior to discharge Estimated LOS: 5-7 days Discharge Concerns: Need to establish a safety plan; Medication compliance and effectiveness Discharge Goals: Return home with outpatient referrals for mental health follow-up including medication management/psychotherapy  Cecilie Lowers, FNP, pmhnp, fnp-bc. 09/27/2022, 8:57 AMPatient ID: [name], female   DOB: 1995/06/08, 27 y.o.   MRN: [id number] Patient ID: Laconda Basich, female   DOB: 10-19-1995, 27 y.o.   MRN: [id number] Patient ID: Gyselle Matthew, female   DOB: May 12, 1995, 26 y.o.   MRN: [id number]

## 2022-09-27 NOTE — Progress Notes (Signed)
   09/27/22 0545  15 Minute Checks  Location Bathroom/Shower  Visual Appearance Calm  Behavior Composed  Sleep (Behavioral Health Patients Only)  Calculate sleep? (Click Yes once per 24 hr at 0600 safety check) Yes  Documented sleep last 24 hours 3.5

## 2022-09-27 NOTE — Progress Notes (Signed)
Pt is A&OX4, primarily calm but can be loud, denies suicidal ideations, denies homicidal ideations, denies auditory hallucinations and denies visual hallucinations. Pt verbally agrees to approach staff if these become apparent and before harming self or others. Pt denies experiencing nightmares. Mood and affect are congruent. Pt appetite is ok. Complaints of anxiety at times. No c/o distress, pain and/or discomfort at this time. Pt's memory appears to be grossly intact, and Pt hasn't displayed any injurious behaviors. Pt is medication compliant. There's no evidence of suicidal intent. Psychomotor activity was WNL. No s/s of Parkinson, Dystonia, Akathisia and/or Tardive Dyskinesia noted.

## 2022-09-27 NOTE — Group Note (Signed)
Date:  09/27/2022 Time:  8:44 PM  Group Topic/Focus:  Wrap-Up Group:   The focus of this group is to help patients review their daily goal of treatment and discuss progress on daily workbooks.    Participation Level:  Active Participation Quality:  Appropriate  Affect:  Appropriate  Cognitive:  Appropriate  Insight: Appropriate  Engagement in Group:  Engaged  Modes of Intervention:  Education  Additional Comments:  Patient attended and participated in group tonight. She reports that she was told today that she will be leaving tomorrow. That made her day. Something she like about herself is that she is a good person.  She is kind hearted.  Aaron Edelman [place] 09/27/2022, 8:44 PM

## 2022-09-27 NOTE — Progress Notes (Signed)
Patient has been up and active on the unit, attended group this evening and has voiced no complaints. Patient currently denies having pain, -si/hi/a/v hall. Support and encouragement offered, safety maintained on unit, will continue to monitor.   09/27/22 1950  Psych Admission Type (Psych Patients Only)  Admission Status Involuntary  Psychosocial Assessment  Patient Complaints Anxiety  Eye Contact Brief  Facial Expression Animated  Affect Appropriate to circumstance  Speech Logical/coherent  Interaction Assertive  Motor Activity Other (Comment) (wnl)  Appearance/Hygiene Unremarkable  Behavior Characteristics Appropriate to situation  Mood Pleasant  Thought Process  Coherency Circumstantial  Content WDL  Delusions None reported or observed  Perception WDL  Hallucination None reported or observed  Judgment Poor  Confusion None  Danger to Self  Current suicidal ideation? Denies

## 2022-09-27 NOTE — Progress Notes (Signed)
   09/27/22 0900  Psych Admission Type (Psych Patients Only)  Admission Status Involuntary  Psychosocial Assessment  Patient Complaints Anxiety  Eye Contact Brief  Facial Expression Animated  Affect Appropriate to circumstance  Speech Logical/coherent  Interaction Assertive  Motor Activity Fidgety  Appearance/Hygiene Unremarkable  Behavior Characteristics Appropriate to situation  Mood Pleasant  Thought Process  Coherency Circumstantial  Content WDL  Delusions None reported or observed  Perception WDL  Hallucination None reported or observed  Judgment Impaired  Confusion None  Danger to Self  Current suicidal ideation? Denies  Danger to Others  Danger to Others None reported or observed

## 2022-09-27 NOTE — Group Note (Signed)
Date:  09/27/2022 Time:  9:42 AM  Group Topic/Focus:  Goals Group:   The focus of this group is to help patients establish daily goals to achieve during treatment and discuss how the patient can incorporate goal setting into their daily lives to aide in recovery.    Participation Level:  Active  Participation Quality:  Intrusive  Affect:  Appropriate  Cognitive:  Appropriate  Insight: Appropriate  Engagement in Group:  Engaged  Modes of Intervention:  Discussion  Additional Comments:    Donell Beers 09/27/2022, 9:42 AM

## 2022-09-28 MED ORDER — HYDROXYZINE HCL 25 MG PO TABS: 25.0000 mg | ORAL_TABLET | Freq: Three times a day (TID) | ORAL | Status: DC | PRN

## 2022-09-28 MED ORDER — GABAPENTIN 100 MG PO CAPS: 100.0000 mg | ORAL_CAPSULE | Freq: Three times a day (TID) | ORAL | 0 refills | Status: DC

## 2022-09-28 MED ORDER — NICOTINE POLACRILEX 2 MG MT GUM: 2.0000 mg | CHEWING_GUM | OROMUCOSAL | 0 refills | Status: DC | PRN

## 2022-09-28 MED ORDER — RISPERIDONE 2 MG PO TABS: 2.0000 mg | ORAL_TABLET | Freq: Every day | ORAL | 0 refills | Status: DC

## 2022-09-28 MED ORDER — TRAZODONE HCL 50 MG PO TABS: 50.0000 mg | ORAL_TABLET | Freq: Every evening | ORAL | 0 refills | Status: DC | PRN

## 2022-09-28 MED ORDER — HYDROXYZINE HCL 25 MG PO TABS: 25.0000 mg | ORAL_TABLET | Freq: Three times a day (TID) | ORAL | 0 refills | Status: DC | PRN

## 2022-09-28 NOTE — Progress Notes (Signed)
  [facility] Adult Case Management Discharge Plan :  Will you be returning to the same living situation after discharge:  Yes,  staying with mother  At discharge, do you have transportation home?: Yes,  uncle is picking patient up Do you have the ability to pay for your medications: Yes,  insurance   Release of information consent forms completed and in the chart;  Patient's signature needed at discharge.  Patient to Follow up at:  Follow-up Information     [facility]. Go on 10/12/2022.   Specialty: Behavioral Health Why: You have an appointment for medication management services on 10/12/22 at 1:30 pm.  You also have an appointment for therapy services on 11/23/22 at 11:00 am.  Both appts will be held in person.  * Please be aware, after 2 no shows, you will no longer be able to schedule appts and must do walk in time of 7:20 am M-F. Contact information: [address] [place] [address] [phone]                Next level of care provider has access to [facility] Link:yes  Safety Planning and Suicide Prevention discussed: Yes,  mother      Has patient been referred to the Quitline?:   Yes, faxed on 6/10 Tobacco Use: High Risk (09/22/2022)   Patient History    Smoking Tobacco Use: Every Day    Smokeless Tobacco Use: Never    Passive Exposure: Not on file      Patient has been referred for addiction treatment: Patient refused referral for treatment.Gardiner Sleeper Laniyah Rosenwald, LCSW 09/28/2022, 10:22 AM

## 2022-09-28 NOTE — [facility] Suicide Risk Assessment (Signed)
Suicide Risk Assessment  Discharge Assessment    [facility] Discharge Suicide Risk Assessment   Principal Problem: Schizoaffective disorder, bipolar type [facility]) Discharge Diagnoses: Principal Problem:   Schizoaffective disorder, bipolar type (HCC) Active Problems:   Cocaine use disorder, moderate, dependence (HCC)   Cannabis use disorder, severe, dependence (HCC)  Reason for admission:  This is the first psychiatric admission/evaluation in this [facility] for this 27 year old Caucasian female with prior hx of mental illness & substance use disorders. Admitted to the [facility] from the [facility] hospital under IVC with complained of worsening delusional thoughts, disorganized behavior, non-compliance to her medication regimen & feeling of paranoia. After medical evaluation/stabilization, patient was transferred to the [facility] for further psychiatric evaluation/treatments. A review of her toxicology reports has positive THC & cocaine. Per chart review, patient reported at the ED that the reason that she does not take her mental health medications is because, they make feel tired & loopy.   Total Time spent with patient: 30 minutes  Musculoskeletal: Strength & Muscle Tone: within normal limits Gait & Station: normal Patient leans: N/A  Psychiatric Specialty Exam  Presentation  General Appearance:  Appropriate for Environment; Casual; Fairly Groomed  Eye Contact: Good  Speech: Clear and Coherent; Normal Rate  Speech Volume: Normal  Handedness: Right  Mood and Affect  Mood: Anxious; Depressed  Duration of Depression Symptoms: Greater than two weeks  Affect: Appropriate; Congruent  Thought Process  Thought Processes: Coherent; Linear  Descriptions of Associations:Intact  Orientation:Full (Time, Place and Person)  Thought Content:Logical  History of Schizophrenia/Schizoaffective disorder:Yes  Duration of Psychotic Symptoms:Greater than six months  Hallucinations:Hallucinations:  None  Ideas of Reference:None  Suicidal Thoughts:Suicidal Thoughts: No  Homicidal Thoughts:Homicidal Thoughts: No  Sensorium  Memory: Immediate Good; Recent Good  Judgment: Fair  Insight: Fair  Art therapist  Concentration: Good  Attention Span: Good  Recall: Fair  Fund of Knowledge: Fair  Language: Good  Psychomotor Activity  Psychomotor Activity: Psychomotor Activity: Normal (Calm but intrusive)  Assets  Assets: Communication Skills; Desire for Improvement; Housing; Physical Health; Resilience; Social Support  Sleep  Sleep: Sleep: Fair Number of Hours of Sleep: 4  Physical Exam: Physical Exam Constitutional:      Appearance: Normal appearance.  HENT:     Head: Normocephalic.     Nose: Nose normal.     Mouth/Throat:     Mouth: Mucous membranes are moist.  Eyes:     Extraocular Movements: Extraocular movements intact.     Pupils: Pupils are equal, round, and reactive to light.  Cardiovascular:     Rate and Rhythm: Normal rate.     Pulses: Normal pulses.  Pulmonary:     Effort: Pulmonary effort is normal.  Abdominal:     Comments: deferred  Genitourinary:    Comments: deferred  Musculoskeletal:        General: Normal range of motion.     Cervical back: Normal range of motion.  Skin:    General: Skin is warm.  Neurological:     General: No focal deficit present.     Mental Status: She is alert and oriented to person, place, and time.  Psychiatric:        Mood and Affect: Mood normal.        Behavior: Behavior normal.        Thought Content: Thought content normal.    Review of Systems  Constitutional:  Negative for fever.  HENT: Negative.    Eyes: Negative.   Respiratory:  Negative for shortness of breath.   Cardiovascular:  Negative for chest pain and palpitations.  Gastrointestinal:  Negative for nausea and vomiting.  Genitourinary: Negative.   Musculoskeletal: Negative.   Skin:  Negative for itching and rash.   Neurological:  Negative for seizures.  Endo/Heme/Allergies:        See allergy listing  Psychiatric/Behavioral:  Positive for substance abuse (Received therapy and counseling). The patient is nervous/anxious (Improved with medication) and has insomnia (Improved with medication).    Blood pressure 118/83, pulse 94, temperature 97.9 F (36.6 C), temperature source Oral, resp. rate 20, height 5\' 3"  (1.6 m), weight 59.4 kg, SpO2 100 %. Body mass index is 23.21 kg/m.  Mental Status Per Nursing Assessment::   On Admission:  NA  Demographic Factors:  Adolescent or young adult, Caucasian, Low socioeconomic status, and Unemployed  Loss Factors: NA  Historical Factors: Impulsivity  Risk Reduction Factors:   Responsible for children under 25 years of age, Living with another person, especially a relative, Positive social support, Positive therapeutic relationship, and Positive coping skills or problem solving skills  Continued Clinical Symptoms:  Bipolar Disorder:   Depressive phase Depression:   Recent sense of peace/wellbeing Alcohol/Substance Abuse/Dependencies Schizophrenia:   Less than 25 years old More than one psychiatric diagnosis Unstable or Poor Therapeutic Relationship Previous Psychiatric Diagnoses and Treatments  Cognitive Features That Contribute To Risk:  Polarized thinking    Suicide Risk:  Mild:  Suicidal ideation of limited frequency, intensity, duration, and specificity.  There are no identifiable plans, no associated intent, mild dysphoria and related symptoms, good self-control (both objective and subjective assessment), few other risk factors, and identifiable protective factors, including available and accessible social support.   Follow-up Information     [facility]. Go on 10/12/2022.   Specialty: Behavioral Health Why: You have an appointment for medication management services on 10/12/22 at 1:30 pm.  You also have an appointment  for therapy services on 11/23/22 at 11:00 am.  Both appts will be held in person.  * Please be aware, after 2 no shows, you will no longer be able to schedule appts and must do walk in time of 250 am M-F. Contact information: [phone] [place] [address] [phone]                Plan Of Care/Follow-up recommendations:  Discharge Recommendations:  The patient is being discharged with her family. Patient is to take her discharge medications as ordered.  See follow up above. We recommend that she participates in individual therapy to target uncontrollable agitation and substance abuse.  We recommend that she participates in family therapy to target the conflict with her family, to improve communication skills and conflict resolution skills.  Family is to initiate/implement a contingency based behavioral model to address patient's behavior. We recommend that she get AIMS scale, height, weight, blood pressure, fasting lipid panel, fasting blood sugar in three months from discharge if she's on atypical antipsychotics.  Patient will benefit from monitoring of recurrent suicidal ideation since patient is on antidepressant medication. The patient should abstain from all illicit substances and alcohol. If the patient's symptoms worsen or do not continue to improve or if the patient becomes actively suicidal or homicidal then it is recommended that the patient return to the closest hospital emergency room or call 911 for further evaluation and treatment. National Suicide Prevention Lifeline 1800-SUICIDE or [phone]) 771-1714. Please follow up with your primary medical doctor for all other medical  needs.  The patient has been educated on the possible side effects to medications and she/her guardian is to contact a medical professional and inform outpatient provider of any new side effects of medication. She is to take regular diet and activity as tolerated.  Will benefit from moderate daily  exercise. Family was educated about removing/locking any firearms, medications or dangerous products from the home.  Activity:  As tolerated Diet:  Regular Diet  Cecilie Lowers, FNP 09/28/2022, 9:36 AM

## 2022-09-28 NOTE — Progress Notes (Signed)
   09/28/22 0615  15 Minute Checks  Location Bedroom  Visual Appearance Calm  Behavior Composed  Sleep (Behavioral Health Patients Only)  Calculate sleep? (Click Yes once per 24 hr at 0600 safety check) Yes  Documented sleep last 24 hours 6.75

## 2022-09-28 NOTE — Discharge Summary (Signed)
Physician Discharge Summary Note  Patient:  Emma Wilcox is an 27 y.o., female MRN:  [id number] DOB:  02/05/1996 Patient phone:  [phone] (home)  Patient address:   [address] [place] [address],  Total Time spent with patient: 30 minutes  Date of Admission:  09/22/2022 Date of Discharge:  09/28/2022  Reason for Admission:   This is the first psychiatric admission/evaluation in this [facility] for this 27 year old Caucasian female with prior hx of mental illness & substance use disorders. Admitted to the [facility] from the [facility] hospital under IVC with complained of worsening delusional thoughts, disorganized behavior, non-compliance to her medication regimen & feeling of paranoia. After medical evaluation/stabilization, patient was transferred to the [facility] for further psychiatric evaluation/treatments. A review of her toxicology reports has positive THC & cocaine. Per chart review, patient reported at the ED that the reason that she does not take her mental health medications is because, they make feel tired & loopy.   Principal Problem: Schizoaffective disorder, bipolar type [facility]) Discharge Diagnoses: Principal Problem:   Schizoaffective disorder, bipolar type (HCC) Active Problems:   Cocaine use disorder, moderate, dependence (HCC)   Cannabis use disorder, severe, dependence (HCC)  Past Psychiatric History:  Patient reports was diagnosed with ADHD during her childhood years. Was hospitalized at the [facility] in [place] last for similar complaints. Patient is noncomplaint to her treatment regimen.   Past Medical History:  Past Medical History:  Diagnosis Date   Medical history non-contributory     Past Surgical History:  Procedure Laterality Date   MOUTH SURGERY     Family History:  Family History  Problem Relation Age of Onset   Heart disease Mother    Healthy Father    Family Psychiatric  History: See H&P Social History:  Social History   Substance and  Sexual Activity  Alcohol Use Yes     Social History   Substance and Sexual Activity  Drug Use Yes   Types: Marijuana    Social History   Socioeconomic History   Marital status: Single    Spouse name: Not on file   Number of children: Not on file   Years of education: Not on file   Highest education level: Not on file  Occupational History   Not on file  Tobacco Use   Smoking status: Every Day    Packs/day: 0.50    Years: 4.00    Additional pack years: 0.00    Total pack years: 2.00    Types: Cigarettes   Smokeless tobacco: Never  Substance and Sexual Activity   Alcohol use: Yes   Drug use: Yes    Types: Marijuana   Sexual activity: Yes    Birth control/protection: None  Other Topics Concern   Not on file  Social History Narrative   Not on file   Social Determinants of Health   Financial Resource Strain: Not on file  Food Insecurity: Patient Declined (09/22/2022)   Hunger Vital Sign    Worried About Running Out of Food in the Last Year: Patient declined    Ran Out of Food in the Last Year: Patient declined  Transportation Needs: No Transportation Needs (09/22/2022)   PRAPARE - Administrator, Civil Service (Medical): No    Lack of Transportation (Non-Medical): No  Physical Activity: Not on file  Stress: Not on file  Social Connections: Not on file    Hospital Course:  During the patient's hospitalization, patient  had extensive initial psychiatric evaluation, and follow-up psychiatric evaluations every day.  Psychiatric diagnoses provided upon initial assessment:   Schizoaffective disorder, bipolar type    Cocaine use disorder, moderate, dependence    Cannabis use disorder, severe, dependence   Patient's psychiatric medications were adjusted on admission:  Risperdal 1 mg po bid for mood control.  Gabapentin 100 mg po tid for agitation/anxiety. Hydroxyzine 25 mg po Q 4 hrs prn for anxiety.  Trazodone 50 mg po Q bedtime prn for insomnia.  Nicorette  gum 2 mg po prn for nicotine withdrawal.  Agitation protocols: Cont as recommended;  -Benadryl 50 mg po or IM tid prn. -Haldol 5 mg po or IM tid prn.  -Lorazepam 2 mg po or IM tid prn.  During the hospitalization, other adjustments were made to the patient's psychiatric medication regimen:  Risperdal 2 mg po at bedtime for mood control.   Patient's care was discussed during the interdisciplinary team meeting every day during the hospitalization.  The patient denies having side effects to prescribed psychiatric medication.  Gradually, patient started adjusting to milieu. The patient was evaluated each day by a clinical provider to ascertain response to treatment. Improvement was noted by the patient's report of decreasing symptoms, improved sleep and appetite, affect, medication tolerance, behavior, and participation in unit programming.  Patient was asked each day to complete a self inventory noting mood, mental status, pain, new symptoms, anxiety and concerns.    Symptoms were reported as significantly decreased or resolved completely by discharge.   On day of discharge, the patient reports that their mood is stable. The patient denied having suicidal thoughts for more than 48 hours prior to discharge.  Patient denies having homicidal thoughts.  Patient denies having auditory hallucinations.  Patient denies any visual hallucinations or other symptoms of psychosis. The patient was motivated to continue taking medication with a goal of continued improvement in mental health.   The patient reports their target psychiatric symptoms of psychosis responded well to the psychiatric medications, and the patient reports overall benefit other psychiatric hospitalization. Supportive psychotherapy was provided to the patient. The patient also participated in regular group therapy while hospitalized. Coping skills, problem solving as well as relaxation therapies were also part of the unit programming.  Labs  were reviewed with the patient, and abnormal results were discussed with the patient.  The patient is able to verbalize their individual safety plan to this provider.  # It is recommended to the patient to continue psychiatric medications as prescribed, after discharge from the hospital.    # It is recommended to the patient to follow up with your outpatient psychiatric provider and PCP.  # It was discussed with the patient, the impact of alcohol, drugs, tobacco have been there overall psychiatric and medical wellbeing, and total abstinence from substance use was recommended the patient.ed.  # Prescriptions provided or sent directly to preferred pharmacy at discharge. Patient agreeable to plan. Given opportunity to ask questions. Appears to feel comfortable with discharge.    # In the event of worsening symptoms, the patient is instructed to call the crisis hotline, 911 and or go to the nearest ED for appropriate evaluation and treatment of symptoms. To follow-up with primary care provider for other medical issues, concerns and or health care needs  # Patient was discharged to home with a plan to follow up as noted below.  Physical Findings: AIMS: Facial and Oral Movements Muscles of Facial Expression: None, normal Lips and Perioral Area: None,  normal Jaw: None, normal Tongue: None, normal,Extremity Movements Upper (arms, wrists, hands, fingers): None, normal Lower (legs, knees, ankles, toes): None, normal, Trunk Movements Neck, shoulders, hips: None, normal, Overall Severity Severity of abnormal movements (highest score from questions above): None, normal Incapacitation due to abnormal movements: None, normal Patient's awareness of abnormal movements (rate only patient's report): No Awareness, Dental Status Current problems with teeth and/or dentures?: No Does patient usually wear dentures?: No  CIWA:    COWS:     Musculoskeletal: Strength & Muscle Tone: within normal limits Gait  & Station: normal Patient leans: N/A   Psychiatric Specialty Exam:  Presentation  General Appearance:  Appropriate for Environment; Casual; Fairly Groomed  Eye Contact: Good  Speech: Clear and Coherent; Normal Rate  Speech Volume: Normal  Handedness: Right  Mood and Affect  Mood: Anxious; Depressed  Affect: Appropriate; Congruent  Thought Process  Thought Processes: Coherent; Linear  Descriptions of Associations:Intact  Orientation:Full (Time, Place and Person)  Thought Content:Logical  History of Schizophrenia/Schizoaffective disorder:Yes  Duration of Psychotic Symptoms:Greater than six months  Hallucinations:Hallucinations: None  Ideas of Reference:None  Suicidal Thoughts:Suicidal Thoughts: No  Homicidal Thoughts:Homicidal Thoughts: No  Sensorium  Memory: Immediate Good; Recent Good  Judgment: Fair  Insight: Fair  Art therapist  Concentration: Good  Attention Span: Good  Recall: Fair  Fund of Knowledge: Fair  Language: Good  Psychomotor Activity  Psychomotor Activity: Psychomotor Activity: Normal (Calm but intrusive)  Assets  Assets: Communication Skills; Desire for Improvement; Housing; Physical Health; Resilience; Social Support  Sleep  Sleep: Sleep: Fair Number of Hours of Sleep: 4  Physical Exam: Physical Exam Vitals and nursing note reviewed.  Constitutional:      Appearance: Normal appearance.  HENT:     Head: Normocephalic.     Nose: Nose normal.     Mouth/Throat:     Mouth: Mucous membranes are moist.     Pharynx: Oropharynx is clear.  Eyes:     Extraocular Movements: Extraocular movements intact.     Pupils: Pupils are equal, round, and reactive to light.  Cardiovascular:     Rate and Rhythm: Normal rate.     Pulses: Normal pulses.  Pulmonary:     Effort: Pulmonary effort is normal.  Abdominal:     Comments: Deferred  Genitourinary:    Comments: Deferred  Musculoskeletal:         General: Normal range of motion.     Cervical back: Normal range of motion.  Skin:    General: Skin is warm.  Neurological:     General: No focal deficit present.     Mental Status: She is alert and oriented to person, place, and time.  Psychiatric:        Mood and Affect: Mood normal.        Behavior: Behavior normal.        Thought Content: Thought content normal.    Review of Systems  Constitutional:  Negative for chills and fever.  HENT:  Negative for sore throat.   Eyes:  Negative for blurred vision and double vision.  Respiratory:  Negative for shortness of breath.   Cardiovascular:  Negative for chest pain and palpitations.  Gastrointestinal:  Negative for nausea and vomiting.  Genitourinary: Negative.   Musculoskeletal: Negative.   Skin: Negative.   Neurological:  Negative for dizziness, tingling, tremors and headaches.  Endo/Heme/Allergies:        See allergy listing  Psychiatric/Behavioral:  Positive for depression (Stable with medication) and substance abuse (Received  therapy and counseling). The patient is nervous/anxious (Improved with medication) and has insomnia (Improved with medication).    Blood pressure 118/83, pulse 94, temperature 97.9 F (36.6 C), temperature source Oral, resp. rate 20, height 5\' 3"  (1.6 m), weight 59.4 kg, SpO2 100 %. Body mass index is 23.21 kg/m.   Social History   Tobacco Use  Smoking Status Every Day   Packs/day: 0.50   Years: 4.00   Additional pack years: 0.00   Total pack years: 2.00   Types: Cigarettes  Smokeless Tobacco Never   Tobacco Cessation:  A prescription for an FDA-approved tobacco cessation medication provided at discharge  Blood Alcohol level:  Lab Results  Component Value Date   [facility] <10 09/21/2022   ETH <10 11/16/2021    Metabolic Disorder Labs:  Lab Results  Component Value Date   HGBA1C 5.8 (H) 09/24/2022   MPG 120 09/24/2022   MPG 114 09/23/2022   No results found for: "PROLACTIN" Lab Results   Component Value Date   CHOL 146 09/23/2022   TRIG 94 09/23/2022   HDL 39 (L) 09/23/2022   CHOLHDL 3.7 09/23/2022   VLDL 19 09/23/2022   LDLCALC 88 09/23/2022    See Psychiatric Specialty Exam and Suicide Risk Assessment completed by Attending Physician prior to discharge.  Discharge destination:  Home  Is patient on multiple antipsychotic therapies at discharge:  No   Has Patient had three or more failed trials of antipsychotic monotherapy by history:  No  Recommended Plan for Multiple Antipsychotic Therapies: NA  Discharge Instructions     Increase activity slowly   Complete by: As directed        Follow-up Information     [facility]. Go on 10/12/2022.   Specialty: Behavioral Health Why: You have an appointment for medication management services on 10/12/22 at 1:30 pm.  You also have an appointment for therapy services on 11/23/22 at 11:00 am.  Both appts will be held in person.  * Please be aware, after 2 no shows, you will no longer be able to schedule appts and must do walk in time of 7:20 am M-F. Contact information: [address] [place] [address] [phone]                Follow-up recommendations:   Discharge Recommendations:  The patient is being discharged with her family. Patient is to take her discharge medications as ordered.  See follow up above. We recommend that she participates in individual therapy to target uncontrollable agitation and substance abuse.  We recommend that she participates in family therapy to target the conflict with her family, to improve communication skills and conflict resolution skills.  Family is to initiate/implement a contingency based behavioral model to address patient's behavior. We recommend that she get AIMS scale, height, weight, blood pressure, fasting lipid panel, fasting blood sugar in three months from discharge if she's on atypical antipsychotics.  Patient will benefit  from monitoring of recurrent suicidal ideation since patient is on antidepressant medication. The patient should abstain from all illicit substances and alcohol. If the patient's symptoms worsen or do not continue to improve or if the patient becomes actively suicidal or homicidal then it is recommended that the patient return to the closest hospital emergency room or call 911 for further evaluation and treatment. National Suicide Prevention Lifeline 1800-SUICIDE or 808-487-4813. Please follow up with your primary medical doctor for all other medical needs.  The patient has been educated on the  possible side effects to medications and she/her guardian is to contact a medical professional and inform outpatient provider of any new side effects of medication. She is to take regular diet and activity as tolerated.  Will benefit from moderate daily exercise. Family was educated about removing/locking any firearms, medications or dangerous products from the home.  Activity:  As tolerated Diet:  Regular Diet  Signed:  Cecilie Lowers, FNP 09/28/2022, 9:49 AM

## 2022-09-28 NOTE — Progress Notes (Signed)
D: Pt A & O X 4. Denies SI, HI, AVH and pain at this time. D/C home as ordered. Picked up in lobby by her mother.  A: D/C instructions reviewed with pt including prescriptions and follow up appointments; compliance encouraged. All belongings from locker 7 returned to pt at time of departure. Scheduled medications given with verbal education and effects monitored. Safety checks maintained without incident till time of d/c.  R: Pt receptive to care. Compliant with medications when offered. Denies adverse drug reactions when assessed. Verbalized understanding related to d/c instructions. Signed belonging sheet in agreement with items received from locker. Ambulatory with a steady gait. Appears to be in no physical distress at time of departure.

## 2022-10-12 ENCOUNTER — Encounter (HOSPITAL_COMMUNITY): Payer: Self-pay

## 2022-10-12 ENCOUNTER — Ambulatory Visit (HOSPITAL_COMMUNITY): Payer: Medicaid Other | Admitting: Psychiatry

## 2022-11-11 ENCOUNTER — Emergency Department (HOSPITAL_COMMUNITY): Payer: Medicaid Other

## 2022-11-11 ENCOUNTER — Encounter (HOSPITAL_COMMUNITY): Payer: Self-pay | Admitting: Emergency Medicine

## 2022-11-11 ENCOUNTER — Emergency Department (HOSPITAL_COMMUNITY)
Admission: EM | Admit: 2022-11-11 | Discharge: 2022-11-11 | Discharge status: Against Medical Advice | Payer: Medicaid Other | Attending: Emergency Medicine | Admitting: Emergency Medicine

## 2022-11-11 ENCOUNTER — Other Ambulatory Visit: Payer: Self-pay

## 2022-11-11 DIAGNOSIS — M79672 Pain in left foot: Secondary | ICD-10-CM | POA: Insufficient documentation

## 2022-11-11 DIAGNOSIS — Z5329 Procedure and treatment not carried out because of patient's decision for other reasons: Secondary | ICD-10-CM | POA: Insufficient documentation

## 2022-11-11 DIAGNOSIS — M25572 Pain in left ankle and joints of left foot: Secondary | ICD-10-CM | POA: Diagnosis not present

## 2022-11-11 DIAGNOSIS — Z5321 Procedure and treatment not carried out due to patient leaving prior to being seen by health care provider: Secondary | ICD-10-CM

## 2022-11-11 NOTE — ED Triage Notes (Signed)
Pt reports twisting left ankle last night. Pain and unable to bear weight.

## 2022-11-11 NOTE — ED Provider Notes (Signed)
EMERGENCY DEPARTMENT AT [facility] Provider Note   CSN: [id number] Arrival date & time: 11/11/22  1208     History Chief Complaint  Patient presents with   Ankle Pain    Emma Wilcox is a 27 y.o. female reportedly otherwise healthy presents emerged from today for evaluation of left ankle pain.  Patient reports that she stepped on a toy and inverted her left ankle yesterday has been having pain since.  Still able to walk on it however does have some pain.  Denies any numbness or tingling.  Has not tried any medication prior to arrival.  No known drug allergies.   Ankle Pain Associated symptoms: no back pain and no fever        Home Medications Prior to Admission medications   Medication Sig Start Date End Date Taking? Authorizing Provider  gabapentin (NEURONTIN) 100 MG capsule Take 1 capsule (100 mg total) by mouth 3 (three) times daily. 09/28/22   Ntuen, Jesusita Oka, FNP  hydrOXYzine (ATARAX) 25 MG tablet Take 1 tablet (25 mg total) by mouth 3 (three) times daily as needed for anxiety (Sleep). 09/28/22   Cecilie Lowers, FNP  nicotine polacrilex (NICORETTE) 2 MG gum Take 1 each (2 mg total) by mouth as needed for smoking cessation. 09/28/22   Ntuen, Jesusita Oka, FNP  risperiDONE (RISPERDAL) 2 MG tablet Take 1 tablet (2 mg total) by mouth at bedtime. 09/28/22   Ntuen, Jesusita Oka, FNP  traZODone (DESYREL) 50 MG tablet Take 1 tablet (50 mg total) by mouth at bedtime as needed for sleep. 09/28/22   Cecilie Lowers, FNP      Allergies    Patient has no known allergies.    Review of Systems   Review of Systems  Constitutional:  Negative for chills and fever.  Musculoskeletal:  Positive for arthralgias. Negative for back pain.  Neurological:  Negative for numbness.    Physical Exam Updated Vital Signs BP 119/78 (BP Location: Left Arm)   Pulse 87   Temp 98.7 F (37.1 C) (Oral)   Resp 18   Ht 5\' 4"  (1.626 m)   Wt 63.5 kg   LMP 11/04/2022 (Approximate)   SpO2 97%   BMI 24.03  kg/m  Physical Exam Vitals and nursing note reviewed.  Constitutional:      General: She is not in acute distress.    Appearance: Normal appearance. She is not ill-appearing or toxic-appearing.  Eyes:     General: No scleral icterus. Pulmonary:     Effort: Pulmonary effort is normal. No respiratory distress.  Musculoskeletal:        General: Tenderness present.     Comments: Some tenderness and mild swelling noted to the more inferior aspect of the lateral malleoli going more into the lateral dorsum of the left foot.  Some minimal swelling noted.  No overlying wound noted.  No bruising noted.  Palpable DP and PT pulses.  Flexion extension still present with pain.  Patient to wiggle her toes.  Cap refill brisk in all 5 toes.  Skin:    General: Skin is warm and dry.  Neurological:     General: No focal deficit present.     Mental Status: She is alert. Mental status is at baseline.     ED Results / Procedures / Treatments   Labs (all labs ordered are listed, but only abnormal results are displayed) Labs Reviewed  PREGNANCY, URINE    EKG None  Radiology No results found.  Procedures Procedures   Medications Ordered in ED Medications - No data to display  ED Course/ Medical Decision Making/ A&P                           Medical Decision Making Amount and/or Complexity of Data Reviewed Labs: ordered. Radiology: ordered.   27 y.o. female presents to the ER today for evaluation of left ankle pain. Differential diagnosis includes but is not limited to trauma. Vital signs unremarkable. Physical exam as noted above.   The patient has been here for 1 hour and 3 minutes and reports that she has prior type commitments and has another appointment to make to nursing staff.  Images are still pending.  Patient eloped.  Portions of this report may have been transcribed using voice recognition software. Every effort was made to ensure accuracy; however, inadvertent computerized  transcription errors may be present.   Final Clinical Impression(s) / ED Diagnoses Final diagnoses:  Acute left ankle pain  Eloped from emergency department    Rx / DC Orders ED Discharge Orders     None         Achille Rich, Cordelia Poche 11/11/22 1345    Laurence Spates, MD 11/14/22 [phone]

## 2022-11-11 NOTE — ED Notes (Signed)
Pt said she had mental health appt at 1:30 and walked out.

## 2022-11-23 ENCOUNTER — Ambulatory Visit (HOSPITAL_COMMUNITY): Payer: Medicaid Other | Admitting: Clinical

## 2023-01-05 ENCOUNTER — Encounter (HOSPITAL_COMMUNITY): Payer: Self-pay | Admitting: Emergency Medicine

## 2023-01-05 ENCOUNTER — Emergency Department (HOSPITAL_COMMUNITY)
Admission: EM | Admit: 2023-01-05 | Discharge: 2023-01-06 | Disposition: A | Discharge status: Home / Self Care | Payer: Medicaid Other | Attending: Emergency Medicine | Admitting: Emergency Medicine

## 2023-01-05 ENCOUNTER — Emergency Department (HOSPITAL_COMMUNITY): Payer: Medicaid Other

## 2023-01-05 DIAGNOSIS — F22 Delusional disorders: Secondary | ICD-10-CM | POA: Insufficient documentation

## 2023-01-05 DIAGNOSIS — R0602 Shortness of breath: Secondary | ICD-10-CM | POA: Diagnosis present

## 2023-01-05 DIAGNOSIS — N760 Acute vaginitis: Secondary | ICD-10-CM | POA: Insufficient documentation

## 2023-01-05 DIAGNOSIS — B9689 Other specified bacterial agents as the cause of diseases classified elsewhere: Secondary | ICD-10-CM

## 2023-01-05 LAB — WET PREP, GENITAL
Sperm: NONE SEEN
Trich, Wet Prep: NONE SEEN
WBC, Wet Prep HPF POC: >=10 — AB (ref <10)
Yeast Wet Prep HPF POC: NONE SEEN

## 2023-01-05 MED ORDER — METRONIDAZOLE 500 MG PO TABS: 500.0000 mg | ORAL_TABLET | Freq: Two times a day (BID) | ORAL | 0 refills | Status: DC

## 2023-01-05 MED ORDER — AZITHROMYCIN 250 MG PO TABS
1000.0000 mg | ORAL_TABLET | Freq: Once | ORAL | Status: AC
  Administered 2023-01-05: 1000 mg via ORAL
  Filled 2023-01-05: qty 4

## 2023-01-05 MED ORDER — ALBUTEROL SULFATE (2.5 MG/3ML) 0.083% IN NEBU
2.5000 mg | INHALATION_SOLUTION | Freq: Once | RESPIRATORY_TRACT | Status: AC
  Administered 2023-01-05: 2.5 mg via RESPIRATORY_TRACT

## 2023-01-05 MED ORDER — LIDOCAINE HCL (PF) 1 % IJ SOLN
1.0000 mL | Freq: Once | INTRAMUSCULAR | Status: AC
  Administered 2023-01-05: 1.2 mL
  Filled 2023-01-05: qty 5

## 2023-01-05 MED ORDER — CEFTRIAXONE SODIUM 500 MG IJ SOLR
500.0000 mg | Freq: Once | INTRAMUSCULAR | Status: AC
  Administered 2023-01-05: 500 mg via INTRAMUSCULAR
  Filled 2023-01-05: qty 500

## 2023-01-05 NOTE — ED Notes (Signed)
Patient resting no complaints at this time

## 2023-01-05 NOTE — Discharge Instructions (Signed)
Do not drink any alcohol with the medication I have prescribed. Contact a health care provider if: Your symptoms do not improve, even after treatment. You have more discharge or pain when urinating. You have a fever or chills. You have pain in your abdomen or pelvis. You have pain during sex. You have vaginal bleeding between menstrual periods.

## 2023-01-05 NOTE — ED Triage Notes (Signed)
Pt here from home with c/o sob off and on since Friday , does vape and smoke
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
## 2023-07-11 ENCOUNTER — Encounter (HOSPITAL_COMMUNITY): Payer: Self-pay | Admitting: Emergency Medicine

## 2023-07-11 ENCOUNTER — Emergency Department (HOSPITAL_COMMUNITY)
Admission: EM | Admit: 2023-07-11 | Discharge: 2023-07-11 | Disposition: A | Discharge status: Home / Self Care | Attending: Emergency Medicine | Admitting: Emergency Medicine

## 2023-07-11 ENCOUNTER — Other Ambulatory Visit: Payer: Self-pay

## 2023-07-11 DIAGNOSIS — H9201 Otalgia, right ear: Secondary | ICD-10-CM | POA: Diagnosis present

## 2023-07-11 DIAGNOSIS — M25561 Pain in right knee: Secondary | ICD-10-CM | POA: Insufficient documentation

## 2023-07-11 MED ORDER — DOXYCYCLINE HYCLATE 100 MG PO CAPS: 100.0000 mg | ORAL_CAPSULE | Freq: Two times a day (BID) | ORAL | 0 refills | Status: DC

## 2023-07-11 NOTE — Discharge Instructions (Signed)
 Follow-up with your family doctor next week for the results of your test and also to see how you are doing.  If you do not have a family doctor you can follow-up at [facility].  You also have been referred to the GYN clinic if needed

## 2023-07-11 NOTE — ED Triage Notes (Signed)
 Pt c/o right ear pain. Pain started a couple days ago. Pt states she felt like something was in her ear and tried to dig it out. Pt also requesting pap smear stating she feels like something is inside her. Denies N/V/D.

## 2023-07-12 LAB — GC/CHLAMYDIA PROBE AMP (~~LOC~~) NOT AT ARMC
Chlamydia: NEGATIVE
Comment: NEGATIVE
Comment: NORMAL
Neisseria Gonorrhea: NEGATIVE

## 2023-07-12 NOTE — ED Provider Notes (Signed)
 [place] EMERGENCY DEPARTMENT AT [facility] Provider Note   CSN: [id number] Arrival date & time: 07/11/23  1612     History  Chief Complaint  Patient presents with   Otalgia    Emma Wilcox is a 28 y.o. female.  Patient complains of pain in her right knee here and also she is concerned she may have something in her vaginal vault.  The history is provided by the patient and medical records. No language interpreter was used.  Otalgia Location:  Right Behind ear:  No abnormality Quality:  Aching Severity:  Moderate Onset quality:  Sudden Timing:  Constant Chronicity:  New Context: not direct blow   Relieved by:  Nothing Worsened by:  Nothing Associated symptoms: no abdominal pain, no congestion, no cough, no diarrhea, no ear discharge, no headaches and no rash        Home Medications Prior to Admission medications   Medication Sig Start Date End Date Taking? Authorizing Provider  doxycycline (VIBRAMYCIN) 100 MG capsule Take 1 capsule (100 mg total) by mouth 2 (two) times daily. One po bid x 7 days 07/11/23  Yes Bethann Berkshire, MD  gabapentin (NEURONTIN) 100 MG capsule Take 1 capsule (100 mg total) by mouth 3 (three) times daily. 09/28/22   Ntuen, Jesusita Oka, FNP  hydrOXYzine (ATARAX) 25 MG tablet Take 1 tablet (25 mg total) by mouth 3 (three) times daily as needed for anxiety (Sleep). 09/28/22   Ntuen, Jesusita Oka, FNP  metroNIDAZOLE (FLAGYL) 500 MG tablet Take 1 tablet (500 mg total) by mouth 2 (two) times daily. One po bid x 7 days 01/05/23   Arthor Captain, PA-C  nicotine polacrilex (NICORETTE) 2 MG gum Take 1 each (2 mg total) by mouth as needed for smoking cessation. 09/28/22   Ntuen, Jesusita Oka, FNP  risperiDONE (RISPERDAL) 2 MG tablet Take 1 tablet (2 mg total) by mouth at bedtime. 09/28/22   Ntuen, Jesusita Oka, FNP  traZODone (DESYREL) 50 MG tablet Take 1 tablet (50 mg total) by mouth at bedtime as needed for sleep. 09/28/22   Cecilie Lowers, FNP      Allergies    Patient has  no known allergies.    Review of Systems   Review of Systems  Constitutional:  Negative for appetite change and fatigue.  HENT:  Positive for ear pain. Negative for congestion, ear discharge and sinus pressure.   Eyes:  Negative for discharge.  Respiratory:  Negative for cough.   Cardiovascular:  Negative for chest pain.  Gastrointestinal:  Negative for abdominal pain and diarrhea.  Genitourinary:  Negative for frequency and hematuria.  Musculoskeletal:  Negative for back pain.  Skin:  Negative for rash.  Neurological:  Negative for seizures and headaches.  Psychiatric/Behavioral:  Negative for hallucinations.     Physical Exam Updated Vital Signs BP 123/79 (BP Location: Right Arm)   Pulse (!) 106   Temp 99.6 F (37.6 C) (Oral)   Resp 18   Ht 5\' 4"  (1.626 m)   Wt 63.5 kg   LMP 06/13/2023 (Approximate)   SpO2 97%   BMI 24.03 kg/m  Physical Exam Vitals and nursing note reviewed.  Constitutional:      Appearance: She is well-developed.  HENT:     Head: Normocephalic.     Right Ear: Tympanic membrane normal.     Ears:     Comments: Patient has what looks to be an infection in at the beginning of her ear canal that seems to be more  bacterial infection to the skin.  Rest of canal looks okay and TM looks normal    Mouth/Throat:     Mouth: Mucous membranes are moist.  Eyes:     General: No scleral icterus.    Conjunctiva/sclera: Conjunctivae normal.  Neck:     Thyroid: No thyromegaly.  Cardiovascular:     Rate and Rhythm: Normal rate and regular rhythm.     Heart sounds: No murmur heard.    No friction rub. No gallop.  Pulmonary:     Breath sounds: No stridor. No wheezing or rales.  Chest:     Chest wall: No tenderness.  Abdominal:     General: There is no distension.     Tenderness: There is no abdominal tenderness. There is no rebound.  Genitourinary:    Comments: Pelvic exam done with speculum and bimanual exam.  Minor discharge.  No tenderness to cervix.  No  masses felt Musculoskeletal:        General: Normal range of motion.     Cervical back: Neck supple.  Lymphadenopathy:     Cervical: No cervical adenopathy.  Skin:    Findings: No erythema or rash.  Neurological:     Mental Status: She is alert and oriented to person, place, and time.     Motor: No abnormal muscle tone.     Coordination: Coordination normal.  Psychiatric:        Behavior: Behavior normal.     ED Results / Procedures / Treatments   Labs (all labs ordered are listed, but only abnormal results are displayed) Labs Reviewed  GC/CHLAMYDIA PROBE AMP ([place]) NOT AT [facility]    EKG None  Radiology No results found.  Procedures Procedures    Medications Ordered in ED Medications - No data to display  ED Course/ Medical Decision Making/ A&P                                 Medical Decision Making Risk Prescription drug management.   Patient with skin infection near her ear and she will be placed on doxycycline.  She also has minor vaginal discharge and GC chlamydia's have been sent.  She is told to follow-up with her primary care doctor and GYN doctor        Final Clinical Impression(s) / ED Diagnoses Final diagnoses:  Right ear pain    Rx / DC Orders ED Discharge Orders          Ordered    doxycycline (VIBRAMYCIN) 100 MG capsule  2 times daily        07/11/23 Erick Alley, MD 07/12/23 1003

## 2023-07-18 ENCOUNTER — Emergency Department (HOSPITAL_COMMUNITY)
Admission: EM | Admit: 2023-07-18 | Discharge: 2023-07-18 | Disposition: A | Discharge status: Home / Self Care | Attending: Emergency Medicine | Admitting: Emergency Medicine

## 2023-07-18 ENCOUNTER — Encounter (HOSPITAL_COMMUNITY): Payer: Self-pay

## 2023-07-18 DIAGNOSIS — Z760 Encounter for issue of repeat prescription: Secondary | ICD-10-CM | POA: Diagnosis not present

## 2023-07-18 DIAGNOSIS — F600 Paranoid personality disorder: Secondary | ICD-10-CM | POA: Diagnosis present

## 2023-07-18 DIAGNOSIS — F200 Paranoid schizophrenia: Secondary | ICD-10-CM | POA: Insufficient documentation

## 2023-07-18 MED ORDER — RISPERIDONE 2 MG PO TABS: 2.0000 mg | ORAL_TABLET | Freq: Every day | ORAL | 0 refills | Status: DC

## 2023-07-18 MED ORDER — TRAZODONE HCL 50 MG PO TABS: 50.0000 mg | ORAL_TABLET | Freq: Every evening | ORAL | 0 refills | Status: DC | PRN

## 2023-07-18 MED ORDER — HYDROXYZINE HCL 25 MG PO TABS: 25.0000 mg | ORAL_TABLET | Freq: Three times a day (TID) | ORAL | 0 refills | Status: DC | PRN

## 2023-07-18 MED ORDER — GABAPENTIN 100 MG PO CAPS: 100.0000 mg | ORAL_CAPSULE | Freq: Three times a day (TID) | ORAL | 0 refills | Status: DC

## 2023-07-18 NOTE — ED Triage Notes (Signed)
 Pt states she was previously diagnosed with bipolar, schizophrenia, and PTSD and was given prescriptions. Pt has completed these and didn't follow up with psych. Pt states she feels weird and keeps having nightmares, but isn't sure if she's asleep or awake when she has them.  Pt asks for a mental health evaluation.

## 2023-07-18 NOTE — ED Provider Notes (Signed)
 [place] EMERGENCY DEPARTMENT AT [facility] Provider Note   CSN: [id number] Arrival date & time: 07/18/23  2110     History  Chief Complaint  Patient presents with   Mental Health Problem    Emma Wilcox is a 28 y.o. female.  The history is provided by the patient and medical records.  Mental Health Problem  29 year old female with history of substance abuse, schizoaffective disorder, paranoia, presenting to the ED requesting medication assistance.  Patient was admitted to behavioral health June 2024 under IVC due to abnormal behaviors.  She was started on medications and stabilized.  She took these for about a month but never followed up with psychiatry afterwards so has been off of these for about 6 to 8 months.  States over the past few days she has started noticing she is having a little bit of paranoia, feels like people are following her, etc.  She is able to distinguish that this is not real.  She has been having some nightmares and often wakes up wondering if this really happened or if she was asleep.  She denies any suicidal homicidal ideation.  Denies any hallucinations.  She denies any drug or alcohol use.  Home Medications Prior to Admission medications   Medication Sig Start Date End Date Taking? Authorizing Provider  doxycycline (VIBRAMYCIN) 100 MG capsule Take 1 capsule (100 mg total) by mouth 2 (two) times daily. One po bid x 7 days 07/11/23   Bethann Berkshire, MD  gabapentin (NEURONTIN) 100 MG capsule Take 1 capsule (100 mg total) by mouth 3 (three) times daily. 09/28/22   Ntuen, Jesusita Oka, FNP  hydrOXYzine (ATARAX) 25 MG tablet Take 1 tablet (25 mg total) by mouth 3 (three) times daily as needed for anxiety (Sleep). 09/28/22   Ntuen, Jesusita Oka, FNP  metroNIDAZOLE (FLAGYL) 500 MG tablet Take 1 tablet (500 mg total) by mouth 2 (two) times daily. One po bid x 7 days 01/05/23   Arthor Captain, PA-C  nicotine polacrilex (NICORETTE) 2 MG gum Take 1 each (2 mg total) by mouth  as needed for smoking cessation. 09/28/22   Ntuen, Jesusita Oka, FNP  risperiDONE (RISPERDAL) 2 MG tablet Take 1 tablet (2 mg total) by mouth at bedtime. 09/28/22   Ntuen, Jesusita Oka, FNP  traZODone (DESYREL) 50 MG tablet Take 1 tablet (50 mg total) by mouth at bedtime as needed for sleep. 09/28/22   Cecilie Lowers, FNP      Allergies    Patient has no known allergies.    Review of Systems   Review of Systems  Constitutional:        Medication assistance  All other systems reviewed and are negative.   Physical Exam Updated Vital Signs BP 111/69 (BP Location: Left Arm)   Pulse 97   Temp 98.4 F (36.9 C) (Oral)   Resp 18   Ht 5\' 4"  (1.626 m)   Wt 59 kg   LMP 06/13/2023 (Approximate)   SpO2 98%   BMI 22.31 kg/m   Physical Exam Vitals and nursing note reviewed.  Constitutional:      Appearance: She is well-developed.  HENT:     Head: Normocephalic and atraumatic.  Eyes:     Conjunctiva/sclera: Conjunctivae normal.     Pupils: Pupils are equal, round, and reactive to light.  Cardiovascular:     Rate and Rhythm: Normal rate and regular rhythm.     Heart sounds: Normal heart sounds.  Pulmonary:     Effort:  Pulmonary effort is normal.     Breath sounds: Normal breath sounds.  Abdominal:     General: Bowel sounds are normal.     Palpations: Abdomen is soft.     Tenderness: There is no abdominal tenderness. There is no rebound.  Musculoskeletal:        General: Normal range of motion.     Cervical back: Normal range of motion.  Skin:    General: Skin is warm and dry.  Neurological:     Mental Status: She is alert and oriented to person, place, and time.  Psychiatric:     Comments: Good eye contact, thought process is clear concise, does not appear to be responding to internal stimuli Denies SI/HI/AVH     ED Results / Procedures / Treatments   Labs (all labs ordered are listed, but only abnormal results are displayed) Labs Reviewed - No data to  display  EKG None  Radiology No results found.  Procedures Procedures    Medications Ordered in ED Medications - No data to display  ED Course/ Medical Decision Making/ A&P                                 Medical Decision Making Risk Prescription drug management.   28 year old female here requesting medication assistance.  Has been off of her medications for quite some time.  History of paranoid schizophrenia.  Beginning to experience some recurrent paranoia but is able distinguish this is not real.  She is awake, alert, oriented here.  She is making good eye contact, concise thought process.  She denies any SI/HI/AVH.  She does not appear to be responding to any kind of internal stimuli.  Have refilled her medications as prescribed previously for a very limited time.  She is aware that she must follow-up with behavioral health for subsequent refills.  She is given information for local [facility] facility to arrange this.  Can return here for new concerns.  Final Clinical Impression(s) / ED Diagnoses Final diagnoses:  Paranoid schizophrenia (HCC)  Medication refill    Rx / DC Orders ED Discharge Orders          Ordered    gabapentin (NEURONTIN) 100 MG capsule  3 times daily        07/18/23 2245    hydrOXYzine (ATARAX) 25 MG tablet  3 times daily PRN        07/18/23 2245    risperiDONE (RISPERDAL) 2 MG tablet  Daily at bedtime        07/18/23 2245    traZODone (DESYREL) 50 MG tablet  At bedtime PRN        07/18/23 2245              Garlon Hatchet, PA-C 07/18/23 2257    Terald Sleeper, MD 07/18/23 [phone]

## 2023-07-18 NOTE — Discharge Instructions (Addendum)
 I have refilled your medication for a limited time. You must follow-up with behavioral health for subsequent refills-- call in the morning to get appt scheduled. Return here for new concerns.

## 2023-10-17 ENCOUNTER — Ambulatory Visit (HOSPITAL_COMMUNITY)
Admission: EM | Admit: 2023-10-17 | Discharge: 2023-10-18 | Disposition: A | Discharge status: Other Acute Inpatient Hospital | Payer: MEDICAID | Attending: Behavioral Health | Admitting: Behavioral Health

## 2023-10-17 DIAGNOSIS — Z79899 Other long term (current) drug therapy: Secondary | ICD-10-CM | POA: Insufficient documentation

## 2023-10-17 DIAGNOSIS — Z1152 Encounter for screening for COVID-19: Secondary | ICD-10-CM | POA: Diagnosis not present

## 2023-10-17 DIAGNOSIS — F209 Schizophrenia, unspecified: Secondary | ICD-10-CM

## 2023-10-17 DIAGNOSIS — F259 Schizoaffective disorder, unspecified: Secondary | ICD-10-CM | POA: Insufficient documentation

## 2023-10-17 DIAGNOSIS — F1410 Cocaine abuse, uncomplicated: Secondary | ICD-10-CM | POA: Insufficient documentation

## 2023-10-17 LAB — CBC WITH DIFFERENTIAL/PLATELET
Abs Immature Granulocytes: 0.05 10*3/uL (ref 0.00–0.07)
Basophils Absolute: 0 10*3/uL (ref 0.0–0.1)
Basophils Relative: 1 %
Eosinophils Absolute: 0.1 10*3/uL (ref 0.0–0.5)
Eosinophils Relative: 1 %
HCT: 38.5 % (ref 36.0–46.0)
Hemoglobin: 12.7 g/dL (ref 12.0–15.0)
Immature Granulocytes: 1 %
Lymphocytes Relative: 32 %
Lymphs Abs: 2.7 10*3/uL (ref 0.7–4.0)
MCH: 31.1 pg (ref 26.0–34.0)
MCHC: 33 g/dL (ref 30.0–36.0)
MCV: 94.4 fL (ref 80.0–100.0)
Monocytes Absolute: 0.8 10*3/uL (ref 0.1–1.0)
Monocytes Relative: 10 %
Neutro Abs: 4.7 10*3/uL (ref 1.7–7.7)
Neutrophils Relative %: 55 %
Platelets: 285 10*3/uL (ref 150–400)
RBC: 4.08 MIL/uL (ref 3.87–5.11)
RDW: 14.3 % (ref 11.5–15.5)
WBC: 8.4 10*3/uL (ref 4.0–10.5)
nRBC: 0 % (ref 0.0–0.2)

## 2023-10-17 LAB — COMPREHENSIVE METABOLIC PANEL WITH GFR
ALT: 13 U/L (ref 0–44)
AST: 24 U/L (ref 15–41)
Albumin: 3.5 g/dL (ref 3.5–5.0)
Alkaline Phosphatase: 52 U/L (ref 38–126)
Anion gap: 8 (ref 5–15)
BUN: 12 mg/dL (ref 6–20)
CO2: 23 mmol/L (ref 22–32)
Calcium: 9 mg/dL (ref 8.9–10.3)
Chloride: 109 mmol/L (ref 98–111)
Creatinine, Ser: 0.79 mg/dL (ref 0.44–1.00)
GFR, Estimated: >60 mL/min (ref >60)
Glucose, Bld: 82 mg/dL (ref 70–99)
Potassium: 3.9 mmol/L (ref 3.5–5.1)
Sodium: 140 mmol/L (ref 135–145)
Total Bilirubin: 0.7 mg/dL (ref 0.0–1.2)
Total Protein: 6.3 g/dL — ABNORMAL LOW (ref 6.5–8.1)

## 2023-10-17 LAB — POCT URINE DRUG SCREEN - MANUAL ENTRY (I-SCREEN)
POC Amphetamine UR: NOT DETECTED
POC Buprenorphine (BUP): NOT DETECTED
POC Cocaine UR: POSITIVE — AB
POC Marijuana UR: POSITIVE — AB
POC Methadone UR: NOT DETECTED
POC Methamphetamine UR: NOT DETECTED
POC Morphine: NOT DETECTED
POC Oxazepam (BZO): POSITIVE — AB
POC Oxycodone UR: NOT DETECTED
POC Secobarbital (BAR): NOT DETECTED

## 2023-10-17 LAB — LIPID PANEL
Cholesterol: 141 mg/dL (ref 0–200)
HDL: 46 mg/dL (ref >40)
LDL Cholesterol: 86 mg/dL (ref 0–99)
Total CHOL/HDL Ratio: 3.1 ratio
Triglycerides: 44 mg/dL (ref <150)
VLDL: 9 mg/dL (ref 0–40)

## 2023-10-17 LAB — HEMOGLOBIN A1C
Hgb A1c MFr Bld: 5 % (ref 4.8–5.6)
Mean Plasma Glucose: 96.8 mg/dL

## 2023-10-17 LAB — ETHANOL: Alcohol, Ethyl (B): <15 mg/dL (ref <15)

## 2023-10-17 LAB — SARS CORONAVIRUS 2 BY RT PCR: SARS Coronavirus 2 by RT PCR: NEGATIVE

## 2023-10-17 LAB — TSH: TSH: 0.807 u[IU]/mL (ref 0.350–4.500)

## 2023-10-17 LAB — POC URINE PREG, ED: Preg Test, Ur: NEGATIVE

## 2023-10-17 MED ORDER — DIPHENHYDRAMINE HCL 50 MG/ML IJ SOLN
50.0000 mg | Freq: Three times a day (TID) | INTRAMUSCULAR | Status: DC | PRN
  Administered 2023-10-17: 50 mg via INTRAMUSCULAR
  Filled 2023-10-17: qty 1

## 2023-10-17 MED ORDER — ALUM & MAG HYDROXIDE-SIMETH 200-200-20 MG/5ML PO SUSP: 30.0000 mL | ORAL | Status: DC | PRN

## 2023-10-17 MED ORDER — HALOPERIDOL 5 MG PO TABS: 5.0000 mg | ORAL_TABLET | Freq: Three times a day (TID) | ORAL | Status: DC | PRN

## 2023-10-17 MED ORDER — DIPHENHYDRAMINE HCL 50 MG/ML IJ SOLN: 50.0000 mg | Freq: Three times a day (TID) | INTRAMUSCULAR | Status: DC | PRN

## 2023-10-17 MED ORDER — TRAZODONE HCL 50 MG PO TABS: 50.0000 mg | ORAL_TABLET | Freq: Every evening | ORAL | Status: DC | PRN

## 2023-10-17 MED ORDER — MAGNESIUM HYDROXIDE 400 MG/5ML PO SUSP: 30.0000 mL | Freq: Every day | ORAL | Status: DC | PRN

## 2023-10-17 MED ORDER — HYDROXYZINE HCL 25 MG PO TABS: 25.0000 mg | ORAL_TABLET | Freq: Three times a day (TID) | ORAL | Status: DC | PRN

## 2023-10-17 MED ORDER — ACETAMINOPHEN 325 MG PO TABS: 650.0000 mg | ORAL_TABLET | Freq: Four times a day (QID) | ORAL | Status: DC | PRN

## 2023-10-17 MED ORDER — DIPHENHYDRAMINE HCL 50 MG PO CAPS: 50.0000 mg | ORAL_CAPSULE | Freq: Three times a day (TID) | ORAL | Status: DC | PRN

## 2023-10-17 MED ORDER — HALOPERIDOL LACTATE 5 MG/ML IJ SOLN
5.0000 mg | Freq: Three times a day (TID) | INTRAMUSCULAR | Status: DC | PRN
  Administered 2023-10-17: 5 mg via INTRAMUSCULAR
  Filled 2023-10-17: qty 1

## 2023-10-17 MED ORDER — HALOPERIDOL LACTATE 5 MG/ML IJ SOLN: 10.0000 mg | Freq: Three times a day (TID) | INTRAMUSCULAR | Status: DC | PRN

## 2023-10-17 MED ORDER — RISPERIDONE 1 MG PO TABS
1.0000 mg | ORAL_TABLET | Freq: Two times a day (BID) | ORAL | Status: DC
  Administered 2023-10-17 (×2): 1 mg via ORAL
  Filled 2023-10-17 (×2): qty 1

## 2023-10-17 MED ORDER — LORAZEPAM 2 MG/ML IJ SOLN: 2.0000 mg | Freq: Three times a day (TID) | INTRAMUSCULAR | Status: DC | PRN

## 2023-10-17 MED ORDER — LORAZEPAM 2 MG/ML IJ SOLN
2.0000 mg | Freq: Three times a day (TID) | INTRAMUSCULAR | Status: DC | PRN
  Administered 2023-10-17: 2 mg via INTRAMUSCULAR
  Filled 2023-10-17: qty 1
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
## 2023-10-17 NOTE — Discharge Instructions (Addendum)
Transfer to [facility] 

## 2023-10-17 NOTE — ED Notes (Addendum)
 Patient taken to the bathroom. Patient given urine cup. UDS Completed.

## 2023-10-17 NOTE — ED Notes (Signed)
 Writer attempted to complete a suicide safety assessment. Patient resting with head under covers, breathing even and unlabored, patient hard to rouse.

## 2023-10-17 NOTE — ED Notes (Signed)
 Patient admitted to continuous observation unit d/t hallucinations. Patient agitated and irritable, currently under IVC petition and requesting to leave. Patient hyperfixated on wall color stating the color of the walls in several areas are the same as my [bedroom or bathroom} man, this is messed up. Patient appears to be responding to internal stimuli, patient stated she says y'all are going to kill me. Patient required moderate agitation protocol prior to arrival on unit, patient changed into scrub pants and personal top. Other patient belongings placed in locker. Potential contraband found during STARR event of a small baggie containing an unknown substance. Item given to [facility] security for disposal. Environment secured, safety checks in place per facility policy.

## 2023-10-17 NOTE — ED Notes (Signed)
 Patient easily aroused from asleep. Patient able to answer some initial shift nursing questions. Patient became sleepily again during assessment and went back to sleep.

## 2023-10-17 NOTE — ED Notes (Signed)
   10/17/23 0856  Description of events or circumstances leading to restrictive event  Precipitating circumstances leading to onset of behavior patient under IVC, requesting to leave, refusing care and meds, agitated/irritable  Patient Behavior Refusing Medication  Clinical Justification for Restrictive Event  Imminent danger to: Self;Others  Patient is restrained for the purpose of administering medication Yes  Less Restrictive Interventions Tried Prior to Seclusion/Restraint  Comfort Interventions used prior to seclusion or restraint Food/Drink  Therapeutic Interventions used prior to seclusion or restraint Show of Support;Kind, Enforceable Limits;Verbal Deescalation  Diversionary Interventions used prior to seclusion or restraint Other (Comment) (verbal de-escalation, show of support, kind enforcable limits)  Other  Interventions used prior to seclusion or restraint Other (Comment) (verbal de-escalation, show of support, kind enforcable limits)  Patient's response to Less Restrictive Intervention Increase in Behavior  Interventions used in Restrictive Event Upper Arm Shoulder Hold  RN Initiating Seclusion or Restraint Dafne Nield, RN/CN  Date Seclusion or Restraint initiated 10/17/23  Time Seclusion or Restraint Initiated 0856  Restriction order entered into order management Yes  Criteria for release from seclusion or restraint Successful Medication Administration  Patient informed of Criteria for Release? Yes  Admission Assessment & Health History reviewed & considered prior to intervention? Yes  Pre-existing medical conditions, disabilities, or limitations that place pt at greater risk for seclusion or restraint? None  Health Status prior to intervention No problems noted  Family Notification of Seclusion/Retraint  Name of person identified on Acknowledgement Form n/a  Seclusion/Restraint 15-minute checks  Signs of injury with seclusion/restraint No  Food offered (offer hourly and prn)  No - Patient too agitated  Fluid offered (offer hourly and prn) No - Patient too agitated  Meets Criteria for Release No  Patient helped to meet criteria for release Yes  Privacy maintained Yes  Monitored 1:1 Yes  Hygiene/Toileting No - Patient too agitated  Respiratory Status Fast  Circulation/Skin check Normal color  Passive Range of Motion if in 4-point restraints Not applicable  Physical status/Comfort Normal movement;Denies pain  Psychological Status/Comfort Alert;Disoriented  Behavior Yelling/screaming;Lying or sitting;Disrobing;Fighting restraints;Standing  Restraint Status Not applicable  Seclusion Status Not applicable  Manual Hold Status Manual_hold  Vital signs for Seclusion/Restraints-Hourly and PRN  Resp 20  Unable to perform vital signs Patient too agitated

## 2023-10-17 NOTE — ED Notes (Signed)
 Patient currently sleeping and resting in recliner. Attempted to assess patient but unable to due to patient sedated.  RR even and unlabored, appearing in no noted distress. Environmental check complete.

## 2023-10-17 NOTE — ED Notes (Signed)
 Second attempt to obtain blood specimen without success. Nurse accessed vein and filled red-top tube halfway. Patient moved arm and took out the butterfly needle. Plan of care ongoing, no further concerns as of present. Patient expresses no other needs at this time.
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
## 2023-10-17 NOTE — ED Notes (Signed)
 Patient resting in lounger with eyes closed, respirations even and unlabored. Patient in no apparent acute distress. Environment secured. Safety checks in place per facility protocol.
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
## 2023-10-17 NOTE — ED Notes (Signed)
 Attempted to obtain blood specimen without success. Nurse accessed vein and witnessed blood return. Patient moved arm and took out the butterfly needle. Did not allow nurse to attempt a second blood draw. Plan of care ongoing, no further concerns as of present. Patient expresses no other needs at this time.

## 2023-10-18 ENCOUNTER — Telehealth (HOSPITAL_COMMUNITY): Payer: Self-pay | Admitting: Pharmacy Technician

## 2023-10-18 ENCOUNTER — Other Ambulatory Visit: Payer: Self-pay

## 2023-10-18 ENCOUNTER — Encounter (HOSPITAL_COMMUNITY): Payer: Self-pay

## 2023-10-18 ENCOUNTER — Encounter (HOSPITAL_COMMUNITY): Payer: Self-pay | Admitting: Behavioral Health

## 2023-10-18 ENCOUNTER — Inpatient Hospital Stay (HOSPITAL_COMMUNITY)
Admission: AD | Admit: 2023-10-18 | Discharge: 2023-10-23 | DRG: 885 | Disposition: A | Discharge status: Home / Self Care | Payer: MEDICAID | Source: Intra-hospital

## 2023-10-18 ENCOUNTER — Other Ambulatory Visit (HOSPITAL_COMMUNITY): Payer: Self-pay

## 2023-10-18 DIAGNOSIS — Z8249 Family history of ischemic heart disease and other diseases of the circulatory system: Secondary | ICD-10-CM

## 2023-10-18 DIAGNOSIS — F203 Undifferentiated schizophrenia: Secondary | ICD-10-CM | POA: Diagnosis not present

## 2023-10-18 DIAGNOSIS — Z1152 Encounter for screening for COVID-19: Secondary | ICD-10-CM | POA: Diagnosis not present

## 2023-10-18 DIAGNOSIS — Z560 Unemployment, unspecified: Secondary | ICD-10-CM | POA: Diagnosis not present

## 2023-10-18 DIAGNOSIS — G4700 Insomnia, unspecified: Secondary | ICD-10-CM | POA: Diagnosis present

## 2023-10-18 DIAGNOSIS — F17290 Nicotine dependence, other tobacco product, uncomplicated: Secondary | ICD-10-CM | POA: Diagnosis present

## 2023-10-18 DIAGNOSIS — F250 Schizoaffective disorder, bipolar type: Secondary | ICD-10-CM | POA: Diagnosis not present

## 2023-10-18 DIAGNOSIS — F200 Paranoid schizophrenia: Secondary | ICD-10-CM | POA: Diagnosis not present

## 2023-10-18 DIAGNOSIS — F17210 Nicotine dependence, cigarettes, uncomplicated: Secondary | ICD-10-CM | POA: Diagnosis present

## 2023-10-18 DIAGNOSIS — F209 Schizophrenia, unspecified: Principal | ICD-10-CM | POA: Diagnosis present

## 2023-10-18 MED ORDER — HALOPERIDOL 5 MG PO TABS
5.0000 mg | ORAL_TABLET | Freq: Three times a day (TID) | ORAL | Status: DC | PRN
  Administered 2023-10-19: 5 mg via ORAL
  Filled 2023-10-18: qty 1

## 2023-10-18 MED ORDER — DIPHENHYDRAMINE HCL 50 MG/ML IJ SOLN: 50.0000 mg | Freq: Three times a day (TID) | INTRAMUSCULAR | Status: DC | PRN

## 2023-10-18 MED ORDER — HALOPERIDOL LACTATE 5 MG/ML IJ SOLN: 10.0000 mg | Freq: Three times a day (TID) | INTRAMUSCULAR | Status: DC | PRN

## 2023-10-18 MED ORDER — DIPHENHYDRAMINE HCL 25 MG PO CAPS
50.0000 mg | ORAL_CAPSULE | Freq: Three times a day (TID) | ORAL | Status: DC | PRN
  Administered 2023-10-18 – 2023-10-19 (×2): 50 mg via ORAL
  Filled 2023-10-18 (×2): qty 2

## 2023-10-18 MED ORDER — ALUM & MAG HYDROXIDE-SIMETH 200-200-20 MG/5ML PO SUSP: 30.0000 mL | ORAL | Status: DC | PRN

## 2023-10-18 MED ORDER — RISPERIDONE 1 MG PO TABS
1.0000 mg | ORAL_TABLET | Freq: Two times a day (BID) | ORAL | Status: AC
  Administered 2023-10-18: 1 mg via ORAL
  Filled 2023-10-18 (×2): qty 1

## 2023-10-18 MED ORDER — ENSURE PLUS HIGH PROTEIN PO LIQD
237.0000 mL | Freq: Two times a day (BID) | ORAL | Status: DC
  Administered 2023-10-19 – 2023-10-22 (×5): 237 mL via ORAL
  Filled 2023-10-18 (×11): qty 237

## 2023-10-18 MED ORDER — MAGNESIUM HYDROXIDE 400 MG/5ML PO SUSP: 30.0000 mL | Freq: Every day | ORAL | Status: DC | PRN

## 2023-10-18 MED ORDER — LORAZEPAM 2 MG/ML IJ SOLN: 2.0000 mg | Freq: Three times a day (TID) | INTRAMUSCULAR | Status: DC | PRN

## 2023-10-18 MED ORDER — ACETAMINOPHEN 325 MG PO TABS
650.0000 mg | ORAL_TABLET | Freq: Four times a day (QID) | ORAL | Status: DC | PRN
  Administered 2023-10-22 (×2): 650 mg via ORAL
  Filled 2023-10-18 (×2): qty 2

## 2023-10-18 MED ORDER — TRAZODONE HCL 50 MG PO TABS
50.0000 mg | ORAL_TABLET | Freq: Every evening | ORAL | Status: DC | PRN
  Administered 2023-10-19 – 2023-10-22 (×5): 50 mg via ORAL
  Filled 2023-10-18 (×4): qty 1

## 2023-10-18 MED ORDER — HALOPERIDOL LACTATE 5 MG/ML IJ SOLN: 5.0000 mg | Freq: Three times a day (TID) | INTRAMUSCULAR | Status: DC | PRN

## 2023-10-18 MED ORDER — HYDROXYZINE HCL 25 MG PO TABS
25.0000 mg | ORAL_TABLET | Freq: Three times a day (TID) | ORAL | Status: DC | PRN
  Administered 2023-10-20 – 2023-10-22 (×3): 25 mg via ORAL
  Filled 2023-10-18 (×3): qty 1

## 2023-10-18 MED ORDER — DIPHENHYDRAMINE HCL 50 MG/ML IJ SOLN
50.0000 mg | Freq: Three times a day (TID) | INTRAMUSCULAR | Status: DC | PRN
  Filled 2023-10-18 (×2): qty 1

## 2023-10-18 MED ORDER — RISPERIDONE 2 MG PO TABS: 2.0000 mg | ORAL_TABLET | Freq: Every day | ORAL | Status: DC
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
## 2023-10-18 NOTE — Progress Notes (Signed)
   10/18/23 2200  Psych Admission Type (Psych Patients Only)  Admission Status Involuntary  Psychosocial Assessment  Eye Contact Other (Comment);None (pt was lying in bed)  Facial Expression Other (Comment) (pt was lying in bed)  Affect Appropriate to circumstance  Speech Soft  Interaction No initiation  Motor Activity Slow  Appearance/Hygiene Disheveled  Behavior Characteristics Cooperative  Mood Pleasant  Aggressive Behavior  Effect No apparent injury  Thought Process  Coherency Tangential  Content UTA  Delusions None reported or observed  Perception UTA  Hallucination None reported or observed  Judgment Impaired  Confusion None  Danger to Self  Current suicidal ideation? Denies

## 2023-10-18 NOTE — Group Note (Signed)
 LCSW Group Therapy Note   Group Date: 10/18/2023 Start Time: 1100 End Time: 1200   Participation:  did not attend (patient arrived at the hospital today and was asleep)  Objective:  To explore loneliness, boundaries, and safe ways to build relationships.  Goals: Recognize healthy vs. unhealthy relationships. Learn safe ways to connect with others. Strengthen communication and [employer].  Summary:  Participants discussed loneliness, healthy connections, and setting boundaries. They explored safe ways to meet people and shared personal experiences. Key insights were reinforced through discussion and quotes.  Therapeutic Modalities Used: Cognitive Behavioral Therapy (CBT) Elements - Identifying unhealthy relationship patterns, challenging negative thoughts about connection. Dialectical Behavior Therapy (DBT) Elements - Interpersonal effectiveness, setting and maintaining boundaries. Supportive Group Therapy - Peer discussion, shared experiences, and emotional validation.   Emma Wilcox O Chirsty Armistead, LCSWA 10/18/2023  5:36 PM

## 2023-10-18 NOTE — Plan of Care (Signed)
  Problem: Education: Goal: Emotional status will improve Outcome: Progressing   Problem: Coping: Goal: Ability to demonstrate self-control will improve Outcome: Progressing   Problem: Coping: Goal: Will verbalize feelings Outcome: Progressing
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
## 2023-10-18 NOTE — Telephone Encounter (Signed)
 Pharmacy Patient Advocate Encounter   Received notification from Inpatient Request that prior authorization for Uzedy  75MG /0.21ML syringes is required/requested.   Insurance verification completed.   The patient is insured through [facility] .   Per test claim: PA required; PA submitted to above mentioned insurance via CoverMyMeds Key/confirmation #/EOC [id number] Status is pending

## 2023-10-18 NOTE — BHH Suicide Risk Assessment (Signed)
 This is a suicide Risk Assessment  Admission Assessment    [facility] Admission Suicide Risk Assessment   Nursing information obtained from:  Patient Demographic factors:  Adolescent or young adult Current Mental Status:  NA Loss Factors:  NA Historical Factors:  NA Risk Reduction Factors:  NA  Total Time spent with patient: 30 minutes Principal Problem: Schizophrenia (HCC) Diagnosis:  Principal Problem:   Schizophrenia (HCC)  Subjective Data: See H&P  Continued Clinical Symptoms:    The Alcohol Use Disorders Identification Test, Guidelines for Use in Primary Care, Second Edition.  World Science writer [facility]). Score between 0-7:  no or low risk or alcohol related problems. Score between 8-15:  moderate risk of alcohol related problems. Score between 16-19:  high risk of alcohol related problems. Score 20 or above:  warrants further diagnostic evaluation for alcohol dependence and treatment.   CLINICAL FACTORS:   Depression:   Hopelessness Schizophrenia:   Paranoid or undifferentiated type More than one psychiatric diagnosis Unstable or Poor Therapeutic Relationship Previous Psychiatric Diagnoses and Treatments   Musculoskeletal: Strength & Muscle Tone: within normal limits Gait & Station: normal Patient leans: N/A  Psychiatric Specialty Exam:  Presentation  General Appearance:  Disheveled  Eye Contact: Fleeting  Speech: Pressured  Speech Volume: Increased  Handedness: Right   Mood and Affect  Mood: Labile  Affect: Labile   Thought Process  Thought Processes: Disorganized; Irrevelant  Descriptions of Associations:Tangential  Orientation:Full (Time, Place and Person)  Thought Content:Illogical; Scattered; Tangential  History of Schizophrenia/Schizoaffective disorder:Yes  Duration of Psychotic Symptoms:N/A  Hallucinations:Hallucinations: None  Ideas of Reference:Delusions  Suicidal Thoughts:Suicidal Thoughts: No  Homicidal  Thoughts:Homicidal Thoughts: No   Sensorium  Memory: Immediate Poor; Recent Poor; Remote Poor  Judgment: Poor  Insight: Poor   Executive Functions  Concentration: Poor  Attention Span: Poor  Recall: Poor  Fund of Knowledge: Poor  Language: Poor   Psychomotor Activity  Psychomotor Activity: Psychomotor Activity: Restlessness   Assets  Assets: Manufacturing systems engineer; Physical Health; Leisure Time   Sleep  Sleep: Sleep: Poor    Physical Exam: Physical Exam  see H&P ROS  see H&P Blood pressure 105/62, pulse 88, temperature 98.2 F (36.8 C), temperature source Oral, resp. rate 18, height 5' 2 (1.575 m), weight 54.4 kg, SpO2 99%. Body mass index is 21.95 kg/m.   COGNITIVE FEATURES THAT CONTRIBUTE TO RISK:  Loss of executive function    SUICIDE RISK:   Mild:  Suicidal ideation of limited frequency, intensity, duration, and specificity.  There are no identifiable plans, no associated intent, mild dysphoria and related symptoms, good self-control (both objective and subjective assessment), few other risk factors, and identifiable protective factors, including available and accessible social support.  PLAN OF CARE: See H&P  I certify that inpatient services furnished can reasonably be expected to improve the patient's condition.   Blair Chiquita Hint, NP 10/18/2023, 2:37 PM
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
## 2023-10-18 NOTE — Group Note (Signed)
 Recreation Therapy Group Note   Group Topic:Coping Skills  Group Date: 10/18/2023 Start Time: 1019 End Time: 1057 Facilitators: Willine Schwalbe-McCall, LRT,CTRS Location: [address] Dayroom   Group Topic: Coping Skills   Goal Area(s) Addresses: Patient will define what a coping skill is. Patient will successfully identify positive coping skills they can use post d/c.  Patient will acknowledge benefit(s) of using learned coping skills post d/c.  Behavioral Response:    Intervention: Worksheet, Music   Activity: Coping A to Z. Patient asked to identify what a coping skill is and when they use them. Patients with Clinical research associate discussed healthy versus unhealthy coping skills. Next patients were given a blank worksheet titled Coping Skills A-Z. Partners were instructed to come up with at least one positive coping skill per letter of the alphabet, addressing a specific challenge (ex: stress, anger, anxiety, depression, grief, doubt, isolation, self-harm/suicidal thoughts, substance use). Patients were given 15 minutes to brainstorm, before ideas were presented to the large group. Patients and LRT debriefed on the importance of coping skill selection based on situation and back-up plans when a skill tried is not effective. At the end of group, patients were given an handout of alphabetized strategies to keep for future reference.   Education: Pharmacologist, Scientist, physiological, Discharge Planning.    Education Outcome: Acknowledges education/Verbalizes understanding/In group clarification offered/Additional education needed    Affect/Mood: N/A   Participation Level: Did not attend    Clinical Observations/Individualized Feedback:     Plan: Continue to engage patient in RT group sessions 2-3x/week.   Dione Mccombie-McCall, LRT,CTRS  10/18/2023 1:21 PM

## 2023-10-18 NOTE — Telephone Encounter (Signed)
 Patient Product/process development scientist completed.    The patient is insured through [facility] [place].     Ran test claim for Uzedy  75 mg/0.21 ml and Prior Authorization Required   This test claim was processed through [facility]- copay amounts may vary at other pharmacies due to [employer], or as the patient moves through the different stages of their insurance plan.     Reyes Sharps, CPHT Pharmacy Technician III Certified Patient Advocate [facility] Pharmacy Patient Advocate Team Direct Number: [phone]  Fax: [phone]

## 2023-10-18 NOTE — Plan of Care (Signed)
 Problem: Education: Goal: Knowledge of [facility] Education information/materials will improve Outcome: Progressing Goal: Emotional status will improve Outcome: Progressing Goal: Mental status will improve Outcome: Progressing Goal: Verbalization of understanding the information provided will improve Outcome: Progressing   Problem: Activity: Goal: Interest or engagement in activities will improve Outcome: Progressing Goal: Sleeping patterns will improve Outcome: Progressing   Problem: Coping: Goal: Ability to verbalize frustrations and anger appropriately will improve Outcome: Progressing Goal: Ability to demonstrate self-control will improve Outcome: Progressing   Problem: Health Behavior/Discharge Planning: Goal: Identification of resources available to assist in meeting health care needs will improve Outcome: Progressing Goal: Compliance with treatment plan for underlying cause of condition will improve Outcome: Progressing   Problem: Physical Regulation: Goal: Ability to maintain clinical measurements within normal limits will improve Outcome: Progressing   Problem: Safety: Goal: Periods of time without injury will increase Outcome: Progressing   Problem: Activity: Goal: Will verbalize the importance of balancing activity with adequate rest periods Outcome: Progressing   Problem: Education: Goal: Will be free of psychotic symptoms Outcome: Progressing Goal: Knowledge of the prescribed therapeutic regimen will improve Outcome: Progressing   Problem: Coping: Goal: Coping ability will improve Outcome: Progressing Goal: Will verbalize feelings Outcome: Progressing   Problem: Health Behavior/Discharge Planning: Goal: Compliance with prescribed medication regimen will improve Outcome: Progressing   Problem: Nutritional: Goal: Ability to achieve adequate nutritional intake will improve Outcome: Progressing   Problem: Role Relationship: Goal:  Ability to communicate needs accurately will improve Outcome: Progressing Goal: Ability to interact with others will improve Outcome: Progressing   Problem: Safety: Goal: Ability to redirect hostility and anger into socially appropriate behaviors will improve Outcome: Progressing Goal: Ability to remain free from injury will improve Outcome: Progressing   Problem: Self-Care: Goal: Ability to participate in self-care as condition permits will improve Outcome: Progressing   Problem: Self-Concept: Goal: Will verbalize positive feelings about self Outcome: Progressing   Problem: Education: Goal: Utilization of techniques to improve thought processes will improve Outcome: Progressing Goal: Knowledge of the prescribed therapeutic regimen will improve Outcome: Progressing   Problem: Activity: Goal: Interest or engagement in leisure activities will improve Outcome: Progressing Goal: Imbalance in normal sleep/wake cycle will improve Outcome: Progressing   Problem: Coping: Goal: Coping ability will improve Outcome: Progressing Goal: Will verbalize feelings Outcome: Progressing   Problem: Health Behavior/Discharge Planning: Goal: Ability to make decisions will improve Outcome: Progressing Goal: Compliance with therapeutic regimen will improve Outcome: Progressing   Problem: Role Relationship: Goal: Will demonstrate positive changes in social behaviors and relationships Outcome: Progressing   Problem: Safety: Goal: Ability to disclose and discuss suicidal ideas will improve Outcome: Progressing Goal: Ability to identify and utilize support systems that promote safety will improve Outcome: Progressing   Problem: Self-Concept: Goal: Will verbalize positive feelings about self Outcome: Progressing Goal: Level of anxiety will decrease Outcome: Progressing   Problem: Education: Goal: Utilization of techniques to improve thought processes will improve Outcome:  Progressing Goal: Knowledge of the prescribed therapeutic regimen will improve Outcome: Progressing   Problem: Activity: Goal: Interest or engagement in leisure activities will improve Outcome: Progressing Goal: Imbalance in normal sleep/wake cycle will improve Outcome: Progressing   Problem: Coping: Goal: Coping ability will improve Outcome: Progressing Goal: Will verbalize feelings Outcome: Progressing   Problem: Health Behavior/Discharge Planning: Goal: Ability to make decisions will improve Outcome: Progressing Goal: Compliance with therapeutic regimen will improve Outcome: Progressing   Problem: Role Relationship: Goal: Will demonstrate positive changes in social behaviors and relationships  Outcome: Progressing   Problem: Safety: Goal: Ability to disclose and discuss suicidal ideas will improve Outcome: Progressing Goal: Ability to identify and utilize support systems that promote safety will improve Outcome: Progressing

## 2023-10-18 NOTE — Tx Team (Signed)
 Initial Treatment Plan 10/18/2023 3:54 AM Uri Drusilla FMW:[id number]    PATIENT STRESSORS: Medication change or noncompliance   Substance abuse     PATIENT STRENGTHS: Ability for insight    PATIENT IDENTIFIED PROBLEMS: Psychosis  Medication non compliant                   DISCHARGE CRITERIA:  Improved stabilization in mood, thinking, and/or behavior  PRELIMINARY DISCHARGE PLAN: Placement in alternative living arrangements Return to previous living arrangement  PATIENT/FAMILY INVOLVEMENT: This treatment plan has been presented to and reviewed with the patient, Emma Wilcox, and/or family member.  The patient and family have been given the opportunity to ask questions and make suggestions.  Dreama Quale, RN 10/18/2023, 3:54 AM

## 2023-10-18 NOTE — Plan of Care (Signed)
   Problem: Education: Goal: Knowledge of [facility] Education information/materials will improve Outcome: Progressing Goal: Emotional status will improve Outcome: Progressing Goal: Mental status will improve Outcome: Progressing

## 2023-10-18 NOTE — BH IP Treatment Plan (Signed)
 Interdisciplinary Treatment and Diagnostic Plan Update  10/18/2023 Time of Session: 1035AM Emma Wilcox MRN: [id number]  Principal Diagnosis: Schizophrenia [facility])  Secondary Diagnoses: Principal Problem:   Schizophrenia (HCC)   Current Medications:  Current Facility-Administered Medications  Medication Dose Route Frequency Provider Last Rate Last Admin   acetaminophen  (TYLENOL ) tablet 650 mg  650 mg Oral Q6H PRN White, Patrice L, NP       alum & mag hydroxide-simeth (MAALOX/MYLANTA) 200-200-20 MG/5ML suspension 30 mL  30 mL Oral Q4H PRN White, Patrice L, NP       haloperidol  (HALDOL ) tablet 5 mg  5 mg Oral TID PRN White, Patrice L, NP       And   diphenhydrAMINE  (BENADRYL ) capsule 50 mg  50 mg Oral TID PRN White, Patrice L, NP       haloperidol  lactate (HALDOL ) injection 5 mg  5 mg Intramuscular TID PRN White, Patrice L, NP       And   diphenhydrAMINE  (BENADRYL ) injection 50 mg  50 mg Intramuscular TID PRN White, Patrice L, NP       And   LORazepam  (ATIVAN ) injection 2 mg  2 mg Intramuscular TID PRN White, Patrice L, NP       haloperidol  lactate (HALDOL ) injection 10 mg  10 mg Intramuscular TID PRN White, Patrice L, NP       And   diphenhydrAMINE  (BENADRYL ) injection 50 mg  50 mg Intramuscular TID PRN White, Patrice L, NP       And   LORazepam  (ATIVAN ) injection 2 mg  2 mg Intramuscular TID PRN White, Patrice L, NP       hydrOXYzine  (ATARAX ) tablet 25 mg  25 mg Oral TID PRN White, Patrice L, NP       magnesium  hydroxide (MILK OF MAGNESIA) suspension 30 mL  30 mL Oral Daily PRN White, Patrice L, NP       risperiDONE  (RISPERDAL ) tablet 1 mg  1 mg Oral BID Izediuno, Jerrell LABOR, MD       [START ON 10/19/2023] risperiDONE  (RISPERDAL ) tablet 2 mg  2 mg Oral QHS Izediuno, Vincent A, MD       traZODone  (DESYREL ) tablet 50 mg  50 mg Oral QHS PRN White, Patrice L, NP       PTA Medications: No medications prior to admission.    Patient Stressors: Medication change or noncompliance   Substance  abuse    Patient Strengths: Ability for insight   Treatment Modalities: Medication Management, Group therapy, Case management,  1 to 1 session with clinician, Psychoeducation, Recreational therapy.   Physician Treatment Plan for Primary Diagnosis: Schizophrenia (HCC) Long Term Goal(s):     Short Term Goals:    Medication Management: Evaluate patient's response, side effects, and tolerance of medication regimen.  Therapeutic Interventions: 1 to 1 sessions, Unit Group sessions and Medication administration.  Evaluation of Outcomes: Not Progressing  Physician Treatment Plan for Secondary Diagnosis: Principal Problem:   Schizophrenia (HCC)  Long Term Goal(s):     Short Term Goals:       Medication Management: Evaluate patient's response, side effects, and tolerance of medication regimen.  Therapeutic Interventions: 1 to 1 sessions, Unit Group sessions and Medication administration.  Evaluation of Outcomes: Not Progressing   RN Treatment Plan for Primary Diagnosis: Schizophrenia (HCC) Long Term Goal(s): Knowledge of disease and therapeutic regimen to maintain health will improve  Short Term Goals: Ability to remain free from injury will improve, Ability to verbalize frustration and anger appropriately will improve,  Ability to demonstrate self-control, Ability to participate in decision making will improve, Ability to verbalize feelings will improve, Ability to disclose and discuss suicidal ideas, Ability to identify and develop effective coping behaviors will improve, and Compliance with prescribed medications will improve  Medication Management: RN will administer medications as ordered by provider, will assess and evaluate patient's response and provide education to patient for prescribed medication. RN will report any adverse and/or side effects to prescribing provider.  Therapeutic Interventions: 1 on 1 counseling sessions, Psychoeducation, Medication administration, Evaluate  responses to treatment, Monitor vital signs and CBGs as ordered, Perform/monitor CIWA, COWS, AIMS and Fall Risk screenings as ordered, Perform wound care treatments as ordered.  Evaluation of Outcomes: Not Progressing   LCSW Treatment Plan for Primary Diagnosis: Schizophrenia (HCC) Long Term Goal(s): Safe transition to appropriate next level of care at discharge, Engage patient in therapeutic group addressing interpersonal concerns.  Short Term Goals: Engage patient in aftercare planning with referrals and resources, Increase social support, Increase ability to appropriately verbalize feelings, Increase emotional regulation, Facilitate acceptance of mental health diagnosis and concerns, Facilitate patient progression through stages of change regarding substance use diagnoses and concerns, Identify triggers associated with mental health/substance abuse issues, and Increase skills for wellness and recovery  Therapeutic Interventions: Assess for all discharge needs, 1 to 1 time with Social worker, Explore available resources and support systems, Assess for adequacy in community support network, Educate family and significant other(s) on suicide prevention, Complete Psychosocial Assessment, Interpersonal group therapy.  Evaluation of Outcomes: Not Progressing   Progress in Treatment: Attending groups: No. Participating in groups: No. Taking medication as prescribed: Yes. Toleration medication: Yes. Family/Significant other contact made: No, will contact:  psa/consents pending Patient understands diagnosis: Yes. Discussing patient identified problems/goals with staff: Yes. Medical problems stabilized or resolved: Yes. Denies suicidal/homicidal ideation: Yes. Issues/concerns per patient self-inventory: No. Other: n/a  New problem(s) identified: No, Describe:  None  New Short Term/Long Term Goal(s): detox, medication management for mood stabilization; elimination of SI thoughts; development of  comprehensive mental wellness/sobriety plan  Patient Goals: nothing  Discharge Plan or Barriers: Patient recently admitted. CSW will continue to follow and assess for appropriate referrals and possible discharge planning.    Reason for Continuation of Hospitalization: Delusions  Medication stabilization Other; describe Mood stabilization, discharge planning  Estimated Length of Stay: 5-7 DAYS  Last 3 [place] Suicide Severity Risk Score: Flowsheet Row [facility] (Current) [facility] from 10[facility]17/2023 in [facility] ED from 07/18/2023 in [facility] Emergency Department at [facility]  C-SSRS RISK CATEGORY No Risk No Risk No Risk    Last [facility] 2/9 Scores:     No data to display          Scribe for Treatment Team: Emmalou Hunger N Tressia Labrum, LCSW 10/18/2023 2:12 PM

## 2023-10-18 NOTE — ED Notes (Addendum)
 [employer] arrived. Patient discharged from [facility]. Patient belongings given to [employer].

## 2023-10-18 NOTE — Group Note (Signed)
 Date:  10/18/2023 Time:  8:50 PM  Group Topic/Focus:  Wrap-Up Group:   The focus of this group is to help patients review their daily goal of treatment and discuss progress on daily workbooks.    Participation Level:  Did Not Attend   Emma Wilcox 10/18/2023, 8:50 PM

## 2023-10-19 ENCOUNTER — Other Ambulatory Visit (HOSPITAL_COMMUNITY): Payer: Self-pay

## 2023-10-19 LAB — VITAMIN D 25 HYDROXY (VIT D DEFICIENCY, FRACTURES): Vit D, 25-Hydroxy: 36.3 ng/mL (ref 30–100)

## 2023-10-19 LAB — FOLATE: Folate: 8.7 ng/mL (ref >5.9)

## 2023-10-19 MED ORDER — NICOTINE 14 MG/24HR TD PT24
14.0000 mg | MEDICATED_PATCH | Freq: Every day | TRANSDERMAL | Status: DC
  Administered 2023-10-19 – 2023-10-20 (×2): 14 mg via TRANSDERMAL
  Filled 2023-10-19 (×3): qty 1

## 2023-10-19 MED ORDER — DIPHENHYDRAMINE HCL 50 MG/ML IJ SOLN
50.0000 mg | Freq: Once | INTRAMUSCULAR | Status: AC
  Administered 2023-10-19: 50 mg via INTRAMUSCULAR

## 2023-10-19 MED ORDER — RISPERIDONE 1 MG PO TABS: 1.0000 mg | ORAL_TABLET | Freq: Every day | ORAL | Status: DC

## 2023-10-19 MED ORDER — RISPERIDONE ER 50 MG/0.14ML ~~LOC~~ SUSY
75.0000 mg | PREFILLED_SYRINGE | SUBCUTANEOUS | Status: DC
  Filled 2023-10-19: qty 0.21

## 2023-10-19 MED ORDER — RISPERIDONE ER 75 MG/0.21ML ~~LOC~~ SUSY
75.0000 mg | PREFILLED_SYRINGE | SUBCUTANEOUS | Status: DC
  Administered 2023-10-19: 75 mg via SUBCUTANEOUS

## 2023-10-19 NOTE — Telephone Encounter (Signed)
 Pharmacy Patient Advocate Encounter  Received notification from [facility] that Prior Authorization for Uzedy  75MG /0.21ML syringes  has been APPROVED from 10/18/2023 to 10/17/2024. Ran test claim, Copay is $4.00. This test claim was processed through [facility]- copay amounts may vary at other pharmacies due to pharmacy/plan contracts, or as the patient moves through the different stages of their insurance plan.   PA #/Case ID/Reference #: [id number]

## 2023-10-19 NOTE — [facility] Suicide Risk Assessment (Addendum)
 [facility] INPATIENT:  Family/Significant Other Suicide Prevention Education  Suicide Prevention Education:  Contact Attempts: Rawland Severin (mother) [phone], (name of family member/significant other) has been identified by the patient as the family member/significant other with whom the patient will be residing, and identified as the person(s) who will aid the patient in the event of a mental health crisis.  .  Date and time of first attempt:  10/19/2023 / 6:32 PM  CSW left a voicemail.   Jaelah Hauth O Dunia Pringle, LCSWA 10/19/2023, 6:31 PM

## 2023-10-19 NOTE — Progress Notes (Incomplete)
 Patient is responding well to treatment.  She is tolerating risperidone  well.  She has consented to long-acting risperidone  Uzedy .  We will initiate at 75 mg monthly.  We will discontinue oral risperidone  tomorrow.  We will evaluate her and get feedback from family in a couple of days.

## 2023-10-19 NOTE — Progress Notes (Addendum)
   10/19/23 2000  Psych Admission Type (Psych Patients Only)  Admission Status Involuntary  Psychosocial Assessment  Patient Complaints Anxiety  Eye Contact Fair  Facial Expression Animated;Anxious  Affect Preoccupied;Apprehensive;Euphoric  Speech Pressured;Rapid  Interaction Assertive  Motor Activity Hyperactive  Appearance/Hygiene Unremarkable  Behavior Characteristics Cooperative;Restless;Anxious;Hyperactive  Mood Pleasant  Thought Chartered certified accountant of ideas;Disorganized  Content Preoccupation  Delusions None reported or observed  Perception WDL  Hallucination None reported or observed  Judgment Impaired  Confusion None  Danger to Self  Current suicidal ideation? Denies   Pt verbalize being apprehensive about take schedule risperidone . Pt stated she does not like the way the medication makes her feel. Pt stated she was slight agitated. Prn haldol  and benadryl  was administered evident by the [facility]. Will continue safety checks q15.

## 2023-10-19 NOTE — Progress Notes (Signed)
 Recreation Therapy Notes  INPATIENT RECREATION THERAPY ASSESSMENT  Patient Details Name: Nalah Macioce MRN: [id number] DOB: [birth year] Today's Date: 10/19/2023       Information Obtained From: Patient  Able to Participate in Assessment/Interview: Yes  Patient Presentation: Alert  Reason for Admission (Per Patient): Other (Comments) (voluntary)  Patient Stressors: Family (Pt stated she was worried about her kids.)  Coping Skills:   Journal, TV, Sports, Exercise, Music, Meditate, Deep Breathing, Talk, Art, Prayer, Avoidance, Read, Dance, Hot Bath/Shower, Other (Comment) (Walk away; Communicate)  Leisure Interests (2+):  Social - Family, Music - Listen, Technical brewer - Other (Comment) (Listen to nature)  Frequency of Recreation/Participation: Other (Comment) (every few weeks)  Awareness of Community Resources:  No  Expressed Interest in [employer] Information: Yes (Pt wanted a list of housing places in the county. LRT informed Child psychotherapist of pt request.)  [place] of Residence:  [place]  Patient Main Form of Transportation: Other (Comment) (Dad)  Patient Strengths:  I don't know  Patient Identified Areas of Improvement:  everything  Patient Goal for Hospitalization:  to find myself again; love myself  Current SI (including self-harm):  No  Current HI:  No  Current AVH: No  Staff Intervention Plan: Group Attendance, Collaborate with Interdisciplinary Treatment Team  Consent to Intern Participation: N/A   Envy Meno-McCall, LRT,CTRS Keri Tavella A Chanan Detwiler-McCall 10/19/2023, 4:10 PM

## 2023-10-19 NOTE — Progress Notes (Signed)
 Patient stable and in good condition. Received Risperdal  susy 75 mg and later received Benadryl  50 mg IM for c/o involuntary muscle contractions. Benadryl  was effective.

## 2023-10-19 NOTE — Group Note (Signed)
 Recreation Therapy Group Note   Group Topic:Communication  Group Date: 10/19/2023 Start Time: 1035 End Time: 1100 Facilitators: Nikko Quast-McCall, LRT,CTRS Location: 500 Hall Dayroom   Group Topic: Communication  Goal Area(s) Addresses:  Patient will effectively communicate in group.  Patient will verbalize benefit of healthy communication. Patient will verbalize positive effect of healthy communication on post d/c goals.   Behavioral Response: Engaged  Intervention: Open Discussion  Activity: LRT and patients had an open discussion about various topics that were important to them. Patients addressed future plans, how to accomplish those plans and other things that interest them.   Education: Communication, Discharge Planning  Education Outcome: Acknowledges understanding/In group clarification offered/Needs additional education.    Affect/Mood: Appropriate   Participation Level: Engaged   Participation Quality: Independent   Behavior: Appropriate and Attentive    Speech/Thought Process: Focused   Insight: Good   Judgement: Good   Modes of Intervention: Open Conversation   Patient Response to Interventions:  Engaged   Education Outcome:  In group clarification offered    Clinical Observations/Individualized Feedback: Pt was engaged and interactive during group. Pt talked about wanting to work on her attitude. When asked what brings out the bad attitude, pt expressed the vibes of other people. Pt explained she usually deals with those bad encounters by walking away and communication. Pt also expressed wanting to work on her parenting. Pt gave further detail of being concerned about their well-being and wanting the close to her. Pt expressed having a support system and was encouraged to communicate/open up to them so they can help her get to/achieve the goals she has for herself. Pt was attentive and focused throughout group session.     Plan: Continue to  engage patient in RT group sessions 2-3x/week.   Emma Wilcox, LRT,CTRS 10/19/2023 2:00 PM

## 2023-10-19 NOTE — Group Note (Signed)
 Date:  10/19/2023 Time:  8:44 PM  Group Topic/Focus:  Wrap-Up Group:   The focus of this group is to help patients review their daily goal of treatment and discuss progress on daily workbooks.    Participation Level:  Active  Participation Quality:  Appropriate  Affect:  Appropriate  Cognitive:  Appropriate  Insight: Appropriate  Engagement in Group:  Engaged  Modes of Intervention:  Education and Exploration  Additional Comments: Patient attended and participated in group tonight. She reports that the best thing that happened for her today is talking to her two year old son.  Gwenn Chillington Dacosta 10/19/2023, 8:44 PM
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
## 2023-10-19 NOTE — Plan of Care (Signed)
 Problem: Education: Goal: Knowledge of [facility] Education information/materials will improve Outcome: Progressing Goal: Emotional status will improve Outcome: Progressing Goal: Mental status will improve Outcome: Progressing Goal: Verbalization of understanding the information provided will improve Outcome: Progressing   Problem: Activity: Goal: Interest or engagement in activities will improve Outcome: Progressing Goal: Sleeping patterns will improve Outcome: Progressing   Problem: Coping: Goal: Ability to verbalize frustrations and anger appropriately will improve Outcome: Progressing Goal: Ability to demonstrate self-control will improve Outcome: Progressing   Problem: Health Behavior/Discharge Planning: Goal: Identification of resources available to assist in meeting health care needs will improve Outcome: Progressing Goal: Compliance with treatment plan for underlying cause of condition will improve Outcome: Progressing   Problem: Physical Regulation: Goal: Ability to maintain clinical measurements within normal limits will improve Outcome: Progressing   Problem: Safety: Goal: Periods of time without injury will increase Outcome: Progressing   Problem: Activity: Goal: Will verbalize the importance of balancing activity with adequate rest periods Outcome: Progressing   Problem: Education: Goal: Will be free of psychotic symptoms Outcome: Progressing Goal: Knowledge of the prescribed therapeutic regimen will improve Outcome: Progressing   Problem: Coping: Goal: Coping ability will improve Outcome: Progressing Goal: Will verbalize feelings Outcome: Progressing   Problem: Health Behavior/Discharge Planning: Goal: Compliance with prescribed medication regimen will improve Outcome: Progressing   Problem: Nutritional: Goal: Ability to achieve adequate nutritional intake will improve Outcome: Progressing   Problem: Role Relationship: Goal:  Ability to communicate needs accurately will improve Outcome: Progressing Goal: Ability to interact with others will improve Outcome: Progressing   Problem: Safety: Goal: Ability to redirect hostility and anger into socially appropriate behaviors will improve Outcome: Progressing Goal: Ability to remain free from injury will improve Outcome: Progressing   Problem: Self-Care: Goal: Ability to participate in self-care as condition permits will improve Outcome: Progressing   Problem: Self-Concept: Goal: Will verbalize positive feelings about self Outcome: Progressing   Problem: Education: Goal: Utilization of techniques to improve thought processes will improve Outcome: Progressing Goal: Knowledge of the prescribed therapeutic regimen will improve Outcome: Progressing   Problem: Activity: Goal: Interest or engagement in leisure activities will improve Outcome: Progressing Goal: Imbalance in normal sleep/wake cycle will improve Outcome: Progressing   Problem: Coping: Goal: Coping ability will improve Outcome: Progressing Goal: Will verbalize feelings Outcome: Progressing   Problem: Health Behavior/Discharge Planning: Goal: Ability to make decisions will improve Outcome: Progressing Goal: Compliance with therapeutic regimen will improve Outcome: Progressing   Problem: Role Relationship: Goal: Will demonstrate positive changes in social behaviors and relationships Outcome: Progressing   Problem: Safety: Goal: Ability to disclose and discuss suicidal ideas will improve Outcome: Progressing Goal: Ability to identify and utilize support systems that promote safety will improve Outcome: Progressing   Problem: Self-Concept: Goal: Will verbalize positive feelings about self Outcome: Progressing Goal: Level of anxiety will decrease Outcome: Progressing   Problem: Education: Goal: Utilization of techniques to improve thought processes will improve Outcome:  Progressing Goal: Knowledge of the prescribed therapeutic regimen will improve Outcome: Progressing   Problem: Activity: Goal: Interest or engagement in leisure activities will improve Outcome: Progressing Goal: Imbalance in normal sleep/wake cycle will improve Outcome: Progressing   Problem: Coping: Goal: Coping ability will improve Outcome: Progressing Goal: Will verbalize feelings Outcome: Progressing   Problem: Health Behavior/Discharge Planning: Goal: Ability to make decisions will improve Outcome: Progressing Goal: Compliance with therapeutic regimen will improve Outcome: Progressing   Problem: Role Relationship: Goal: Will demonstrate positive changes in social behaviors and relationships  Outcome: Progressing   Problem: Safety: Goal: Ability to disclose and discuss suicidal ideas will improve Outcome: Progressing Goal: Ability to identify and utilize support systems that promote safety will improve Outcome: Progressing

## 2023-10-19 NOTE — [facility] Suicide Risk Assessment (Signed)
 [facility] INPATIENT:  Family/Significant Other Suicide Prevention Education  Suicide Prevention Education:  Education Completed; Jerona (father) [phone],  (name of family member/significant other) has been identified by the patient as the family member/significant other with whom the patient will be residing, and identified as the person(s) who will aid the patient in the event of a mental health crisis (suicidal ideations/suicide attempt).  With written consent from the patient, the family member/significant other has been provided the following suicide prevention education, prior to the and/or following the discharge of the patient.  When asked when patient will go at discharge, father said that she was staying with him before she came to the hospital.  Father said that patient needs help with looking for home but if she doesn't have anywhere to go, she can return to his house.  Dad said they don't have any guns or weapons, there are no guns or weapons in the home, and patient doesn't have any access to guns or weapons.  Dad said he will secure medications and sharp objects (such as knives and scissors).  He didn't have any concerns about patient being discharged if she is okay.  The following people live in the home:  father and patient.  Dad said he spoke with her today, and she is still not doing well, her talk is off.  He said he will visit her tomorrow.  The suicide prevention education provided includes the following: Suicide risk factors Suicide prevention and interventions National Suicide Hotline telephone number [facility] assessment telephone number [facility] Emergency Assistance 911 [facility] and/or Residential Mobile Crisis Unit telephone number  Request made of family/significant other to: Remove weapons (e.g., guns, rifles, knives), all items previously/currently identified as safety concern.   Remove drugs/medications (over-the-counter,  prescriptions, illicit drugs), all items previously/currently identified as a safety concern.  The family member/significant other verbalizes understanding of the suicide prevention education information provided.  The family member/significant other agrees to remove the items of safety concern listed above.  Brittin Belnap O Javionna Leder, LCSWA 10/19/2023, 3:56 PM

## 2023-10-20 DIAGNOSIS — F250 Schizoaffective disorder, bipolar type: Secondary | ICD-10-CM

## 2023-10-20 MED ORDER — DIPHENHYDRAMINE HCL 50 MG/ML IJ SOLN
50.0000 mg | Freq: Once | INTRAMUSCULAR | Status: AC
  Administered 2023-10-20: 50 mg via INTRAMUSCULAR

## 2023-10-20 MED ORDER — DIPHENHYDRAMINE HCL 50 MG/ML IJ SOLN: 50.0000 mg | Freq: Once | INTRAMUSCULAR | Status: DC

## 2023-10-20 NOTE — Group Note (Signed)
 Recreation Therapy Group Note   Group Topic:Leisure Education  Group Date: 10/20/2023 Start Time: 1035 End Time: 1100 Facilitators: Tyronne Blann-McCall, LRT,CTRS Location: [address] Dayroom   Group Topic: Leisure Education  Goal Area(s) Addresses:  Patient will identify positive leisure and recreation activities.  Patient will identify one positive benefit of participation in leisure activities.   Behavioral Response:   Intervention: Group Game  Activity: Guess the Lyric. Patients were to spin the spinner and whatever category [employer], Pop, Hip Hop, R&B, Dance and Indie) the spinner landed on, the patient got a card from that deck. LRT read the lyric and the patient got 3 chances to guess the missing lyric. If they are unable to guess the lyric, the other players got the chance to guess the answer. If the spinner landed on the black triangle, the could steal a card from another player. The person with the most cards was the winner.  Education:  Teacher, English as a foreign language, Special educational needs teacher, Discharge Planning  Education Outcome: Acknowledges education/In group clarification offered/Needs additional education   Affect/Mood: N/A   Participation Level: Did not attend    Clinical Observations/Individualized Feedback:     Plan: Continue to engage patient in RT group sessions 2-3x/week.   Hideo Googe-McCall, LRT,CTRS 10/20/2023 1:38 PM

## 2023-10-20 NOTE — Progress Notes (Signed)
 [facility] MD Progress Note  10/21/2023 6:17 AM Emma Wilcox  MRN:  [id number]  Reason for Admission:   [name] is a 28 year old female with a psychiatric history notable for schizoaffective disorder and cocaine use disorder. She presented to [facility] on October 17, 2023 under involuntary commitment petitioned by her father. She was subsequently admitted to the [facility] on October 18, 2023 for psychiatric stabilization.    Chart Review from last 24 hours:  The patient's chart was reviewed and nursing notes were reviewed. The patient's case was discussed in multidisciplinary team meeting.    - Overnight events to report per chart review / staff report: no notable overnight events to report - Patient received all scheduled medications - Per [facility] patient received PRN trazodone     Information Obtained Today During Patient Interview: On assessment today, the patient describes her mood as I'm okay and stated that she is in a great mood.  She reports that both her depression and anxiety are improving.  She denies any sleep disturbances and states that her appetite is adequate.  Concentration and energy levels are reported to be adequate as well.  The patient denies any suicidal thoughts, intent, or plan.  She also denies homicidal ideation or psychotic symptoms.  She denies side effects from her current psychiatric medication.  She reports attending group sessions on the unit and receiving positive feedback.  The patient shared that she has learned to accept herself as she is and not to judge others.  She stated she is not interested in addiction treatment following discharge but expressed interest in finding employment and engaging in therapy.  Will coordinate with Social Work for referral to outpatient therapy.  The patient continues to show clinical progress.  No overt psychotic symptoms, manic symptoms, or dangerous behaviors.  She received her monthly Uzedy   injection yesterday. The case was reviewed with the attending psychiatrist, with plan to continue current treatment regimen.  October 20, 2023 1523:  Attempted to contact the patient's father, Sham Alviar ([facility]  petitioner), at [phone].  Unable to leave a message as the voicemail box was full.  The care team will continue efforts to reach him in order to obtain feedback.   Principal Problem: Schizophrenia (HCC) Diagnosis: Principal Problem:   Schizophrenia (HCC)  Total Time spent with patient: 45 minutes  Past Psychiatric History: See H&p  Past Medical History:  Past Medical History:  Diagnosis Date   Medical history non-contributory     Past Surgical History:  Procedure Laterality Date   MOUTH SURGERY     Family History:  Family History  Problem Relation Age of Onset   Heart disease Mother    Healthy Father    Family Psychiatric  History: See H&P  Social History:  Social History   Substance and Sexual Activity  Alcohol Use Yes     Social History   Substance and Sexual Activity  Drug Use Yes   Types: Marijuana    Social History   Socioeconomic History   Marital status: Single    Spouse name: Not on file   Number of children: Not on file   Years of education: Not on file   Highest education level: Not on file  Occupational History   Not on file  Tobacco Use   Smoking status: Every Day    Current packs/day: 0.50    Average packs/day: 0.5 packs/day for 4.0 years (2.0 ttl pk-yrs)    Types: Cigarettes   Smokeless  tobacco: Never  Substance and Sexual Activity   Alcohol use: Yes   Drug use: Yes    Types: Marijuana   Sexual activity: Yes    Birth control/protection: None  Other Topics Concern   Not on file  Social History Narrative   Not on file   Social Drivers of Health   Financial Resource Strain: Not on file  Food Insecurity: No Food Insecurity (10/17/2023)   Hunger Vital Sign    Worried About Running Out of Food in the Last Year: Never true     Ran Out of Food in the Last Year: Never true  Transportation Needs: No Transportation Needs (10/17/2023)   PRAPARE - Administrator, Civil Service (Medical): No    Lack of Transportation (Non-Medical): No  Physical Activity: Not on file  Stress: Not on file  Social Connections: Patient Unable To Answer (10/18/2023)   Social Connection and Isolation Panel    Frequency of Communication with Friends and Family: Patient unable to answer    Frequency of Social Gatherings with Friends and Family: Patient unable to answer    Attends Religious Services: Patient unable to answer    Active Member of Clubs or Organizations: Patient unable to answer    Attends Banker Meetings: Patient unable to answer    Marital Status: Patient unable to answer   Additional Social History:                         Sleep: Good Estimated Sleeping Duration (Last 24 Hours): 8.00-9.25 hours  Appetite:  Fair  Current Medications: Current Facility-Administered Medications  Medication Dose Route Frequency Provider Last Rate Last Admin   acetaminophen  (TYLENOL ) tablet 650 mg  650 mg Oral Q6H PRN White, Patrice L, NP       alum & mag hydroxide-simeth (MAALOX/MYLANTA) 200-200-20 MG/5ML suspension 30 mL  30 mL Oral Q4H PRN White, Patrice L, NP       haloperidol  (HALDOL ) tablet 5 mg  5 mg Oral TID PRN White, Patrice L, NP   5 mg at 10/19/23 2056   And   diphenhydrAMINE  (BENADRYL ) capsule 50 mg  50 mg Oral TID PRN Teresa Jes L, NP   50 mg at 10/19/23 2056   haloperidol  lactate (HALDOL ) injection 5 mg  5 mg Intramuscular TID PRN White, Patrice L, NP       And   diphenhydrAMINE  (BENADRYL ) injection 50 mg  50 mg Intramuscular TID PRN White, Patrice L, NP       And   LORazepam  (ATIVAN ) injection 2 mg  2 mg Intramuscular TID PRN White, Patrice L, NP       haloperidol  lactate (HALDOL ) injection 10 mg  10 mg Intramuscular TID PRN White, Patrice L, NP       And   diphenhydrAMINE  (BENADRYL )  injection 50 mg  50 mg Intramuscular TID PRN White, Patrice L, NP       And   LORazepam  (ATIVAN ) injection 2 mg  2 mg Intramuscular TID PRN White, Patrice L, NP       feeding supplement (ENSURE PLUS HIGH PROTEIN) liquid 237 mL  237 mL Oral BID BM Trudy Carwin, NP   237 mL at 10/20/23 1506   hydrOXYzine  (ATARAX ) tablet 25 mg  25 mg Oral TID PRN White, Patrice L, NP   25 mg at 10/20/23 2036   magnesium  hydroxide (MILK OF MAGNESIA) suspension 30 mL  30 mL Oral Daily PRN Teresa Jes  L, NP       nicotine  (NICODERM CQ  - dosed in mg/24 hours) patch 14 mg  14 mg Transdermal Daily Tanaja Ganger H, NP   14 mg at 10/20/23 1202   risperiDONE  ER (UZEDY ) SUSY 75 mg  75 mg Subcutaneous Q28 days Prentis Oliva LABOR, DO   75 mg at 10/19/23 1359   traZODone  (DESYREL ) tablet 50 mg  50 mg Oral QHS PRN White, Patrice L, NP   50 mg at 10/20/23 2036    Lab Results:  Results for orders placed or performed during the hospital encounter of 10/18/23 (from the past 48 hours)  VITAMIN D  25 Hydroxy (Vit-D Deficiency, Fractures)     Status: None   Collection Time: 10/19/23  6:37 AM  Result Value Ref Range   Vit D, 25-Hydroxy 36.30 30 - 100 ng/mL    Comment: (NOTE) Vitamin D  deficiency has been defined by the Institute of Medicine  and an Endocrine Society practice guideline as a level of serum 25-OH  vitamin D  less than 20 ng/mL (1,2). The Endocrine Society went on to  further define vitamin D  insufficiency as a level between 21 and 29  ng/mL (2).  1. IOM (Institute of Medicine). 2010. Dietary reference intakes for  calcium and D. [place]: [employer]. 2. Holick MF, Binkley [place], Bischoff-Ferrari HA, et al. Evaluation,  treatment, and prevention of vitamin D  deficiency: an Endocrine  Society clinical practice guideline, JCEM. 2011 Jul; 96(7): 1911-30.  Performed at [facility], [address], [place], [place] [address]   Folate     Status: None   Collection Time: 10/19/23  6:37  AM  Result Value Ref Range   Folate 8.7 >5.9 ng/mL    Comment: Performed at [facility], [address] [place], [place] [address]    Blood Alcohol level:  Lab Results  Component Value Date   [facility] <15 10/17/2023   ETH <10 09/21/2022    Metabolic Disorder Labs: Lab Results  Component Value Date   HGBA1C 5.0 10/17/2023   MPG 96.8 10/17/2023   MPG 120 09/24/2022   No results found for: PROLACTIN Lab Results  Component Value Date   CHOL 141 10/17/2023   TRIG 44 10/17/2023   HDL 46 10/17/2023   CHOLHDL 3.1 10/17/2023   VLDL 9 10/17/2023   LDLCALC 86 10/17/2023   LDLCALC 88 09/23/2022    Physical Findings: AIMS:  ,  ,  0,  ,  ,  ,   CIWA:   N/A COWS:  N/A   Musculoskeletal: Strength & Muscle Tone: within normal limits Gait & Station: normal Patient leans: N/A  Psychiatric Specialty Exam:  Presentation  General Appearance:  Casual; Fairly Groomed  Eye Contact: Good  Speech: Clear and Coherent  Speech Volume: Normal  Handedness: Right   Mood and Affect  Mood: -- (I'm okay.)  Affect: Congruent   Thought Process  Thought Processes: Goal Directed; Linear  Descriptions of Associations:Intact  Orientation:Full (Time, Place and Person)  Thought Content:Logical  History of Schizophrenia/Schizoaffective disorder:Yes  Duration of Psychotic Symptoms:Greater than six months  Hallucinations:Hallucinations: None  Ideas of Reference:None  Suicidal Thoughts:Suicidal Thoughts: No  Homicidal Thoughts:Homicidal Thoughts: No   Sensorium  Memory: Immediate Good; Remote Good  Judgment: Fair  Insight: Fair   Art therapist  Concentration: Fair  Attention Span: Fair  Recall: Metta Abe of Knowledge: Fair  Language: Fair   Psychomotor Activity  Psychomotor Activity:Psychomotor Activity: Normal   Assets  Assets: Desire for Improvement; Resilience   Sleep  Sleep:Sleep: Fair    Physical  Exam: Physical Exam Vitals and nursing note reviewed.  Constitutional:      General: She is not in acute distress. HENT:     Nose: Nose normal.     Mouth/Throat:     Pharynx: Oropharynx is clear.  Cardiovascular:     Rate and Rhythm: Normal rate.     Pulses: Normal pulses.  Pulmonary:     Effort: No respiratory distress.  Musculoskeletal:        General: Normal range of motion.     Cervical back: Normal range of motion.  Neurological:     Mental Status: She is alert and oriented to person, place, and time.    Review of Systems  Constitutional:  Negative for chills, diaphoresis and fever.  HENT:  Negative for congestion and sore throat.   Respiratory:  Negative for cough and shortness of breath.   Cardiovascular:  Negative for chest pain and palpitations.  Gastrointestinal:  Negative for abdominal pain, constipation, diarrhea, nausea and vomiting.  Genitourinary:  Negative for dysuria.  Skin: Negative.   Neurological:  Negative for dizziness, tingling, tremors and headaches.  Psychiatric/Behavioral:  Positive for depression and substance abuse. Negative for hallucinations, memory loss and suicidal ideas. The patient is nervous/anxious and has insomnia.    Blood pressure 116/73, pulse 75, temperature 98.6 F (37 C), temperature source Oral, resp. rate 18, height 5' 2 (1.575 m), weight 54.4 kg, SpO2 100%. Body mass index is 21.95 kg/m.   Treatment Plan Summary: Daily contact with patient to assess and evaluate symptoms and progress in treatment and Medication management  Diagnoses / Active Problems: Principal Problem:   Schizophrenia (HCC)   PLAN: Safety and Monitoring:             -- Involuntary admission to inpatient psychiatric unit for safety, stabilization and treatment             -- Daily contact with patient to assess and evaluate symptoms and progress in treatment             -- Patient's case to be discussed in multi-disciplinary team meeting             --  Observation Level : q15 minute checks             -- Vital signs:  q12 hours             -- Precautions: suicide, elopement, and assault   2. Psychiatric Diagnoses and Treatment:  # Schizophrenia  -- Discontinue oral Risperdal  (7/1) -- Start Uzedy  75 mg LAI monthly (first dose 7/01)    --  The risks/benefits/side-effects/alternatives to this medication were discussed in detail with the patient and time was given for questions. The patient consents to medication trial.  -- FDA             -- Metabolic profile and EKG monitoring obtained while on an atypical antipsychotic (BMI: Lipid Panel: HbgA1c: QTc:)              -- Encouraged patient to participate in unit milieu and in scheduled group therapies              -- Short Term Goals: Ability to identify changes in lifestyle to reduce recurrence of condition will improve, Ability to verbalize feelings will improve, Ability to demonstrate self-control will improve, Ability to identify and develop effective coping behaviors will improve, Ability to maintain clinical  measurements within normal limits will improve, Compliance with prescribed medications will improve, and Ability to identify triggers associated with substance abuse/mental health issues will improve             -- Long Term Goals: Improvement in symptoms so as ready for discharge                3. Medical Issues Being Addressed:              Tobacco Use Disorder             -- Nicotine  patch 14 mg/24 hours ordered             -- Smoking cessation encouraged               # Nutritional supplementation -- Continue Ensure 2 times daily between meals   Labs and EKG reviewed    4. Discharge Planning:              -- Social work and case management to assist with discharge planning and identification of hospital follow-up needs prior to discharge             -- Estimated LOS: 5-7 days             -- Discharge Concerns: Need to establish a safety plan; Medication compliance and  effectiveness             -- Discharge Goals: Return home with outpatient referrals for mental health follow-up including medication management/psychotherapy    I certify that inpatient services furnished can reasonably be expected to improve the patient's condition.       Blair Chiquita Hint, NP 10/21/2023, 6:17 AM Patient ID: Emma Wilcox, female   DOB: January 16, 1996, 28 y.o.   MRN: [id number]

## 2023-10-20 NOTE — Plan of Care (Addendum)
 Pt visible in dayroom this afternoon for scheduled groups. Noted with EPS symptoms this evening (swollen tongue, difficult speech). Animated but tangential; unable to stay focused on interactions. Denies SI, HI, AVH and pain when assessed. Off unit for meals and to courtyard for recreation. Safety checks maintained at Q 15 minutes intervals without outburst. Support, encouragement and reassurance offered.   Problem: Education: Goal: Will be free of psychotic symptoms Outcome: Progressing   Problem: Nutritional: Goal: Ability to achieve adequate nutritional intake will improve Outcome: Progressing   Problem: Safety: Goal: Ability to remain free from injury will improve Outcome: Progressing

## 2023-10-20 NOTE — Progress Notes (Signed)
 Pt continues to be disorganized at times, but pt is pleasant on the nt, pt focused on D/c , pt encouraged to talk to the doctor tomorrow    10/20/23 2015  Psych Admission Type (Psych Patients Only)  Admission Status Involuntary  Psychosocial Assessment  Patient Complaints Anxiety  Eye Contact Fair  Facial Expression Anxious;Flat  Affect Appropriate to circumstance  Speech Soft  Interaction Assertive  Motor Activity Restless  Appearance/Hygiene Unremarkable  Behavior Characteristics Cooperative  Mood Anxious;Preoccupied  Aggressive Behavior  Effect No apparent injury  Thought Chartered certified accountant of ideas;Circumstantial  Content Preoccupation  Delusions None reported or observed  Perception WDL  Hallucination None reported or observed  Judgment Impaired  Confusion WDL  Danger to Self  Current suicidal ideation? Denies  Danger to Others  Danger to Others None reported or observed

## 2023-10-20 NOTE — Group Note (Signed)
 Date:  10/20/2023 Time:  10:32 AM  Group Topic/Focus:  Goals Group:   The focus of this group is to help patients establish daily goals to achieve during treatment and discuss how the patient can incorporate goal setting into their daily lives to aide in recovery. Orientation:   The focus of this group is to educate the patient on the purpose and policies of crisis stabilization and provide a format to answer questions about their admission.  The group details unit policies and expectations of patients while admitted.    Participation Level:  Did Not Attend   Emma Wilcox 10/20/2023, 10:32 AM

## 2023-10-20 NOTE — Progress Notes (Incomplete)
 Patient is responding well to treatment.  Mania and psychosis is gradually resolving.  She is tolerating her longer acting injectable well.  Her thought process is linear and there is no delusional theme.  Patient is not voicing any rageful thoughts towards self or towards others.  Her family came to visit and it went well. We will discontinue oral risperidone .  We will hopefully step her down to less acute part of the unit tomorrow.  We will continue to evaluate her and get feedback from her parents.

## 2023-10-20 NOTE — Plan of Care (Signed)
   Problem: Education: Goal: Emotional status will improve Outcome: Progressing Goal: Mental status will improve Outcome: Progressing   Problem: Activity: Goal: Interest or engagement in activities will improve Outcome: Progressing Goal: Sleeping patterns will improve Outcome: Progressing   Problem: Safety: Goal: Periods of time without injury will increase Outcome: Progressing

## 2023-10-21 MED ORDER — WHITE PETROLATUM EX OINT
TOPICAL_OINTMENT | CUTANEOUS | Status: AC
  Administered 2023-10-21: 1
  Filled 2023-10-21: qty 5

## 2023-10-21 MED ORDER — NICOTINE POLACRILEX 2 MG MT GUM
2.0000 mg | CHEWING_GUM | OROMUCOSAL | Status: DC | PRN
  Administered 2023-10-22 – 2023-10-23 (×3): 2 mg via ORAL
  Filled 2023-10-21 (×3): qty 1

## 2023-10-21 NOTE — Plan of Care (Addendum)
 Pt reports she slept well last night with good appetite. She does attends to her ADLS without prompts. Denies SI, HI, AVH and pain when assessed. However, she remains disorganized and grandiose on interactions. She believes has 5 babies. Stated she had her first baby at age 28 with rapper Keyvon My family sent me to [place] when I was pregnant with my first child by Keyvon. I was 64 yrs old and then I came back and never saw him again. Irritable and defensive at times when demanding d/c. Remains labile, hyperactive and intrusive at times but is verbally redirectable. Safety checks maintained at Q 15 minutes intervals without outburst. Emotional support, encouragement and reassurance offered.   Problem: Self-Care: Goal: Ability to participate in self-care as condition permits will improve Outcome: Progressing   Problem: Coping: Goal: Coping ability will improve Outcome: Progressing   Problem: Self-Concept: Goal: Level of anxiety will decrease Outcome: Progressing

## 2023-10-21 NOTE — Group Note (Signed)
 Recreation Therapy Group Note   Group Topic:Health and Wellness  Group Date: 10/21/2023 Start Time: 9047 End Time: 1020 Facilitators: Cherell Colvin-McCall, LRT,CTRS Location: [address] Dayroom   Group Topic: Wellness  Goal Area(s) Addresses:  Patient will define components of whole wellness. Patient will verbalize benefit of whole wellness.  Behavioral Response: Engaged  Intervention: Music  Activity: LRT and patients discussed the importance of physical health. LRT then led group in a series of stretches to loosen up their muscles to get them ready for the rest of group. Patients then took turns leading the group in the exercises of their choosing. Group concluded with patients walking 10 laps up and down the hallway. Patients could take breaks if needed.  Education: Wellness, Building control surveyor.   Education Outcome: Acknowledges education/In group clarification offered/Needs additional education.    Affect/Mood: Appropriate   Participation Level: Engaged   Participation Quality: Independent   Behavior: Appropriate   Speech/Thought Process: Focused   Insight: Good   Judgement: Good   Modes of Intervention: Music   Patient Response to Interventions:  Engaged   Education Outcome:  In group clarification offered    Clinical Observations/Individualized Feedback: Pt was bright and in a good mood. Pt was engaged and social with peers as well. Pt was appropriate. Pt had moments were she was concerned about speaking to the doctor. Pt was able to be redirected and get refocused on the activity.     Plan: Continue to engage patient in RT group sessions 2-3x/week.   Raymel Cull-McCall, LRT,CTRS 10/21/2023 12:30 PM

## 2023-10-21 NOTE — Progress Notes (Signed)
   10/21/23 2015  Psychosocial Assessment  Patient Complaints Anxiety;Worrying  Eye Contact Fair  Facial Expression Anxious;Flat  Affect Appropriate to circumstance  Speech Soft  Interaction Assertive  Motor Activity Restless  Appearance/Hygiene Unremarkable  Behavior Characteristics Cooperative  Mood Anxious;Suspicious;Preoccupied  Aggressive Behavior  Effect No apparent injury  Thought Chartered certified accountant of ideas;Circumstantial  Content Preoccupation  Delusions None reported or observed  Perception WDL  Hallucination None reported or observed  Judgment Impaired  Confusion WDL  Danger to Self  Current suicidal ideation? Denies  Danger to Others  Danger to Others None reported or observed

## 2023-10-21 NOTE — [facility] Group Notes (Signed)
 Adult Psychoeducational Group Note  Date:  10/21/2023 Time:  11:32 AM  Group Topic/Focus:  Goals Group:   The focus of this group is to help patients establish daily goals to achieve during treatment and discuss how the patient can incorporate goal setting into their daily lives to aide in recovery. Orientation:   The focus of this group is to educate the patient on the purpose and policies of crisis stabilization and provide a format to answer questions about their admission.  The group details unit policies and expectations of patients while admitted.  Participation Level:  Did Not Attend  Participation Quality:    Affect:    Cognitive:    Insight:   Engagement in Group:    Modes of Intervention:    Additional Comments:    Bernadetta Roell O 10/21/2023, 11:32 AM
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
## 2023-10-21 NOTE — Progress Notes (Signed)
 [facility] MD Progress Note  10/21/2023 5:53 PM Emma Wilcox  MRN:  [id number]  Reason for Admission:   Emma Wilcox is a 28 year old female with a psychiatric history notable for schizoaffective disorder and cocaine use disorder. She presented to [facility] on October 17, 2023 under involuntary commitment petitioned by her father. She was subsequently admitted to the [facility] on October 18, 2023 for psychiatric stabilization.    Chart Review from last 24 hours:  The patient's chart was reviewed and nursing notes were reviewed. The patient's case was discussed in multidisciplinary team meeting.    - Overnight events to report per chart review / staff report: no notable overnight events to report - Patient received all scheduled medications - Per [facility] patient received PRN trazodone  for insomnia, and hydroxyzine  for anxiety   Today's assessment notes: Emma Wilcox on assessment reports her mood is less depressed and stated that she is in a great mood.  She reports that both her depression and anxiety are improving.  She denies any sleep disturbances and nursing staff report patient sleeping over 10 hours last night and feeling restful.  States that her appetite is adequate.  Concentration and energy levels are reported to be adequate as well.  The patient denies any suicidal thoughts, intent, or plan.  She also denies homicidal ideation or psychotic symptoms.  She denies side effects from her current psychiatric medication.  Observed patient attending and participating group sessions and therapeutic milieu and receiving positive feedback.  The patient shared that she has learned to accept herself as she is and not to judge others.  She stated she is not interested in addiction treatment following discharge but expressed interest in finding employment and engaging in therapy.  Will coordinate with Social Work for referral to outpatient therapy.  The patient continues to show clinical  progress.  No overt psychotic symptoms, manic symptoms, or dangerous behaviors.  She received her monthly Uzedy  injection yesterday. The case was reviewed with the attending psychiatrist, with plan to continue current treatment regimen.  October 21, 2023 at 5 PM: Patient's father  Emma Wilcox (IVC  petitioner), at [phone] called for collateral regarding pending discharge.  Father reports that he has been visiting the patient while in the hospital and has noted some improvement in her mood and thought processes.  Reports she could be discharged this weekend and he will be able to pick up the patient to return to his home.  October 20, 2023 1523:  Attempted to contact the patient's father, Emma Wilcox (IVC  petitioner), at [phone].  Unable to leave a message as the voicemail box was full.  The care team will continue efforts to reach him in order to obtain feedback.   Principal Problem: Schizophrenia (HCC) Diagnosis: Principal Problem:   Schizophrenia (HCC)  Total Time spent with patient: 45 minutes  Past Psychiatric History: See H&p  Past Medical History:  Past Medical History:  Diagnosis Date   Medical history non-contributory     Past Surgical History:  Procedure Laterality Date   MOUTH SURGERY     Family History:  Family History  Problem Relation Age of Onset   Heart disease Mother    Healthy Father    Family Psychiatric  History: See H&P  Social History:  Social History   Substance and Sexual Activity  Alcohol Use Yes     Social History   Substance and Sexual Activity  Drug Use Yes   Types: Marijuana  Social History   Socioeconomic History   Marital status: Single    Spouse name: Not on file   Number of children: Not on file   Years of education: Not on file   Highest education level: Not on file  Occupational History   Not on file  Tobacco Use   Smoking status: Every Day    Current packs/day: 0.50    Average packs/day: 0.5 packs/day for 4.0 years  (2.0 ttl pk-yrs)    Types: Cigarettes   Smokeless tobacco: Never  Substance and Sexual Activity   Alcohol use: Yes   Drug use: Yes    Types: Marijuana   Sexual activity: Yes    Birth control/protection: None  Other Topics Concern   Not on file  Social History Narrative   Not on file   Social Drivers of Health   Financial Resource Strain: Not on file  Food Insecurity: No Food Insecurity (10/17/2023)   Hunger Vital Sign    Worried About Running Out of Food in the Last Year: Never true    Ran Out of Food in the Last Year: Never true  Transportation Needs: No Transportation Needs (10/17/2023)   PRAPARE - Administrator, Civil Service (Medical): No    Lack of Transportation (Non-Medical): No  Physical Activity: Not on file  Stress: Not on file  Social Connections: Patient Unable To Answer (10/18/2023)   Social Connection and Isolation Panel    Frequency of Communication with Friends and Family: Patient unable to answer    Frequency of Social Gatherings with Friends and Family: Patient unable to answer    Attends Religious Services: Patient unable to answer    Active Member of Clubs or Organizations: Patient unable to answer    Attends Banker Meetings: Patient unable to answer    Marital Status: Patient unable to answer   Additional Social History:                         Sleep: Good Estimated Sleeping Duration (Last 24 Hours): 7.00-8.00 hours  Appetite:  Fair  Current Medications: Current Facility-Administered Medications  Medication Dose Route Frequency Provider Last Rate Last Admin   acetaminophen  (TYLENOL ) tablet 650 mg  650 mg Oral Q6H PRN White, Patrice L, NP       alum & mag hydroxide-simeth (MAALOX/MYLANTA) 200-200-20 MG/5ML suspension 30 mL  30 mL Oral Q4H PRN White, Patrice L, NP       haloperidol  (HALDOL ) tablet 5 mg  5 mg Oral TID PRN White, Patrice L, NP   5 mg at 10/19/23 2056   And   diphenhydrAMINE  (BENADRYL ) capsule 50 mg   50 mg Oral TID PRN Teresa Jes L, NP   50 mg at 10/19/23 2056   haloperidol  lactate (HALDOL ) injection 5 mg  5 mg Intramuscular TID PRN White, Patrice L, NP       And   diphenhydrAMINE  (BENADRYL ) injection 50 mg  50 mg Intramuscular TID PRN White, Patrice L, NP       And   LORazepam  (ATIVAN ) injection 2 mg  2 mg Intramuscular TID PRN White, Patrice L, NP       haloperidol  lactate (HALDOL ) injection 10 mg  10 mg Intramuscular TID PRN White, Patrice L, NP       And   diphenhydrAMINE  (BENADRYL ) injection 50 mg  50 mg Intramuscular TID PRN White, Patrice L, NP       And   LORazepam  (  ATIVAN ) injection 2 mg  2 mg Intramuscular TID PRN White, Patrice L, NP       feeding supplement (ENSURE PLUS HIGH PROTEIN) liquid 237 mL  237 mL Oral BID BM Trudy Carwin, NP   237 mL at 10/20/23 1506   hydrOXYzine  (ATARAX ) tablet 25 mg  25 mg Oral TID PRN White, Patrice L, NP   25 mg at 10/20/23 2036   magnesium  hydroxide (MILK OF MAGNESIA) suspension 30 mL  30 mL Oral Daily PRN White, Patrice L, NP       nicotine  (NICODERM CQ  - dosed in mg/24 hours) patch 14 mg  14 mg Transdermal Daily Bennett, Christal H, NP   14 mg at 10/20/23 1202   nicotine  polacrilex (NICORETTE ) gum 2 mg  2 mg Oral PRN Nwoko, Agnes I, NP       risperiDONE  ER (UZEDY ) SUSY 75 mg  75 mg Subcutaneous Q28 days Prentis Kitchens A, DO   75 mg at 10/19/23 1359   traZODone  (DESYREL ) tablet 50 mg  50 mg Oral QHS PRN White, Patrice L, NP   50 mg at 10/20/23 2036    Lab Results:  No results found for this or any previous visit (from the past 48 hours).   Blood Alcohol level:  Lab Results  Component Value Date   [facility] <15 10/17/2023   ETH <10 09/21/2022    Metabolic Disorder Labs: Lab Results  Component Value Date   HGBA1C 5.0 10/17/2023   MPG 96.8 10/17/2023   MPG 120 09/24/2022   No results found for: PROLACTIN Lab Results  Component Value Date   CHOL 141 10/17/2023   TRIG 44 10/17/2023   HDL 46 10/17/2023   CHOLHDL 3.1 10/17/2023    VLDL 9 10/17/2023   LDLCALC 86 10/17/2023   LDLCALC 88 09/23/2022    Physical Findings: AIMS:  ,  ,  0,  ,  ,  ,   CIWA:   N/A COWS:  N/A   Musculoskeletal: Strength & Muscle Tone: within normal limits Gait & Station: normal Patient leans: N/A  Psychiatric Specialty Exam:  Presentation  General Appearance:  Appropriate for Environment; Casual; Fairly Groomed  Eye Contact: Good  Speech: Clear and Coherent  Speech Volume: Normal  Handedness: Right  Mood and Affect  Mood: -- (Improving)  Affect: Congruent  Thought Process  Thought Processes: Coherent; Goal Directed; Linear  Descriptions of Associations:Intact  Orientation:Full (Time, Place and Person)  Thought Content:Logical  History of Schizophrenia/Schizoaffective disorder:Yes  Duration of Psychotic Symptoms:Greater than six months  Hallucinations:Hallucinations: None  Ideas of Reference:None  Suicidal Thoughts:Suicidal Thoughts: No  Homicidal Thoughts:Homicidal Thoughts: No  Sensorium  Memory: Immediate Good; Recent Good  Judgment: Fair  Insight: Fair  Art therapist  Concentration: Good  Attention Span: Good  Recall: FairMERL Metta Abe of Knowledge: Fair  Language: Fair  Psychomotor Activity  Psychomotor Activity:Psychomotor Activity: Normal  Assets  Assets: Communication Skills; Desire for Improvement; Housing; Physical Health; Resilience  Sleep  Sleep:Sleep: Good Number of Hours of Sleep: 10  Physical Exam: Physical Exam Vitals and nursing note reviewed.  Constitutional:      General: She is not in acute distress. HENT:     Head: Normocephalic.     Right Ear: External ear normal.     Left Ear: External ear normal.     Nose: Nose normal.     Mouth/Throat:     Mouth: Mucous membranes are moist.     Pharynx: Oropharynx is clear.  Eyes:  Extraocular Movements: Extraocular movements intact.  Cardiovascular:     Rate and Rhythm: Normal rate.      Pulses: Normal pulses.  Pulmonary:     Effort: Pulmonary effort is normal. No respiratory distress.  Abdominal:     Comments: Deferred  Genitourinary:    Comments: Deferred Musculoskeletal:        General: Normal range of motion.     Cervical back: Normal range of motion.  Skin:    General: Skin is warm.  Neurological:     General: No focal deficit present.     Mental Status: She is alert and oriented to person, place, and time.  Psychiatric:        Mood and Affect: Mood normal.        Behavior: Behavior normal.        Thought Content: Thought content normal.    Review of Systems  Constitutional:  Negative for chills, diaphoresis and fever.  HENT:  Negative for congestion and sore throat.   Respiratory:  Negative for cough and shortness of breath.   Cardiovascular:  Negative for chest pain and palpitations.  Gastrointestinal:  Negative for abdominal pain, constipation, diarrhea, nausea and vomiting.  Genitourinary:  Negative for dysuria.  Skin: Negative.   Neurological:  Negative for dizziness, tingling, tremors and headaches.  Psychiatric/Behavioral:  Positive for depression and substance abuse. Negative for hallucinations, memory loss and suicidal ideas. The patient is nervous/anxious and has insomnia.    Blood pressure 124/81, pulse 77, temperature 98.6 F (37 C), temperature source Oral, resp. rate 18, height 5' 2 (1.575 m), weight 54.4 kg, SpO2 100%. Body mass index is 21.95 kg/m.  Treatment Plan Summary: Daily contact with patient to assess and evaluate symptoms and progress in treatment and Medication management  Diagnoses / Active Problems: Principal Problem:   Schizophrenia (HCC)   PLAN: Safety and Monitoring:             -- Involuntary admission to inpatient psychiatric unit for safety, stabilization and treatment             -- Daily contact with patient to assess and evaluate symptoms and progress in treatment             -- Patient's case to be discussed  in multi-disciplinary team meeting             -- Observation Level : q15 minute checks             -- Vital signs:  q12 hours             -- Precautions: suicide, elopement, and assault   2. Psychiatric Diagnoses and Treatment:  # Schizophrenia  -- Discontinue oral Risperdal  (7/1) -- Start Uzedy  75 mg LAI monthly (first dose 7/01)    --  The risks/benefits/side-effects/alternatives to this medication were discussed in detail with the patient and time was given for questions. The patient consents to medication trial.  -- FDA             -- Metabolic profile and EKG monitoring obtained while on an atypical antipsychotic (BMI: Lipid Panel: HbgA1c: QTc:)              -- Encouraged patient to participate in unit milieu and in scheduled group therapies              -- Short Term Goals: Ability to identify changes in lifestyle to reduce recurrence of condition will improve, Ability to verbalize feelings will improve, Ability to  demonstrate self-control will improve, Ability to identify and develop effective coping behaviors will improve, Ability to maintain clinical measurements within normal limits will improve, Compliance with prescribed medications will improve, and Ability to identify triggers associated with substance abuse/mental health issues will improve             -- Long Term Goals: Improvement in symptoms so as ready for discharge                3. Medical Issues Being Addressed:              Tobacco Use Disorder             -- Nicotine  patch 14 mg/24 hours ordered             -- Smoking cessation encouraged               # Nutritional supplementation -- Continue Ensure 2 times daily between meals   Labs and EKG reviewed   4. Discharge Planning:              -- Social work and case management to assist with discharge planning and identification of hospital follow-up needs prior to discharge             -- Estimated LOS: 5-7 days             -- Discharge Concerns: Need to  establish a safety plan; Medication compliance and effectiveness             -- Discharge Goals: Return home with outpatient referrals for mental health follow-up including medication management/psychotherapy    I certify that inpatient services furnished can reasonably be expected to improve the patient's condition.    Ellouise JAYSON Azure, FNP 10/21/2023, 5:53 PM Patient ID: Emma Wilcox, female   DOB: 20-Sep-1995, 28 y.o.   MRN: [id number] Patient ID: Emma Wilcox, female   DOB: 1996-03-21, 28 y.o.   MRN: [id number]

## 2023-10-21 NOTE — Plan of Care (Signed)
   Problem: Education: Goal: Emotional status will improve Outcome: Progressing Goal: Mental status will improve Outcome: Progressing   Problem: Activity: Goal: Interest or engagement in activities will improve Outcome: Progressing Goal: Sleeping patterns will improve Outcome: Progressing

## 2023-10-21 NOTE — Progress Notes (Incomplete)
 Patient is gradually responding to treatment.  She is tolerating her medication well.  No PRNs lately.  No challenging behavior on the unit.  We are still awaiting feedback from her family.  We will transition her to lower acute part of the unit.  Hopeful discharge by weekend if she maintains progress.

## 2023-10-21 NOTE — Group Note (Signed)
 Date:  10/21/2023 Time:  9:24 PM  Group Topic/Focus:  Wrap-Up Group:   The focus of this group is to help patients review their daily goal of treatment and discuss progress on daily workbooks.    Participation Level:  Active  Participation Quality:  Appropriate and Attentive  Affect:  Appropriate  Cognitive:  Appropriate  Insight: Appropriate  Engagement in Group:  Engaged  Modes of Intervention:  Discussion  Additional Comments:  Patient stated that she had a good day.  She stated that she is excited about going home on Saturday.  She also rated her day a 10 out of 10 her goal is to work on her attitude.  Bari Moats 10/21/2023, 9:24 PM

## 2023-10-22 ENCOUNTER — Encounter (HOSPITAL_COMMUNITY): Payer: Self-pay

## 2023-10-22 DIAGNOSIS — F203 Undifferentiated schizophrenia: Secondary | ICD-10-CM

## 2023-10-22 NOTE — [facility] Suicide Risk Assessment (Signed)
 Conversation with patient:  CSW asked patient if she would like her to call substance use treatment center recommended by her mom (they were unsure if the treatment center would be open today, July 4th).  Patient preferred to take the information and told CSW that she would call them when she leaves the hospital   [facility] [address], [place], [place] [address], [phone]   Catherene Dynes, LCSWA 10/22/2023

## 2023-10-22 NOTE — Group Note (Signed)
 Date:  10/22/2023 Time:  8:13 PM  Group Topic/Focus:  Wrap-Up Group:   The focus of this group is to help patients review their daily goal of treatment and discuss progress on daily workbooks.    Participation Level:  Active  Participation Quality:  Appropriate  Affect:  Appropriate  Cognitive:  Appropriate  Insight: Appropriate  Engagement in Group:  Engaged  Modes of Intervention:  Education and Exploration  Additional Comments:  Patient attended and participated in group tonight. She reports that today her goal was to make today a better day than yesterday. And she did.  Gwenn Chillington Dacosta 10/22/2023, 8:13 PM

## 2023-10-22 NOTE — Progress Notes (Signed)
   10/22/23 2045  Psych Admission Type (Psych Patients Only)  Admission Status Involuntary  Psychosocial Assessment  Patient Complaints Anxiety;Worrying  Eye Contact Fair  Facial Expression Anxious;Flat  Affect Appropriate to circumstance  Speech Soft  Interaction Assertive  Motor Activity Restless  Appearance/Hygiene Unremarkable  Behavior Characteristics Cooperative  Mood Anxious  Aggressive Behavior  Effect No apparent injury  Thought Process  Coherency Flight of ideas;Circumstantial  Content Preoccupation  Delusions None reported or observed  Perception WDL  Hallucination None reported or observed  Judgment Impaired  Confusion WDL  Danger to Self  Current suicidal ideation? Denies  Danger to Others  Danger to Others None reported or observed

## 2023-10-22 NOTE — BH IP Treatment Plan (Signed)
 Interdisciplinary Treatment and Diagnostic Plan Update  10/22/2023 Time of Session: 12:25 PM - UPDATE Emma Wilcox MRN: [id number]  Principal Diagnosis: Schizophrenia (HCC)  Secondary Diagnoses: Principal Problem:   Schizophrenia (HCC)   Current Medications:  Current Facility-Administered Medications  Medication Dose Route Frequency Provider Last Rate Last Admin   acetaminophen  (TYLENOL ) tablet 650 mg  650 mg Oral Q6H PRN White, Patrice L, NP   650 mg at 10/22/23 1145   alum & mag hydroxide-simeth (MAALOX/MYLANTA) 200-200-20 MG/5ML suspension 30 mL  30 mL Oral Q4H PRN White, Patrice L, NP       haloperidol  (HALDOL ) tablet 5 mg  5 mg Oral TID PRN Teresa Jes L, NP   5 mg at 10/19/23 2056   And   diphenhydrAMINE  (BENADRYL ) capsule 50 mg  50 mg Oral TID PRN White, Patrice L, NP   50 mg at 10/19/23 2056   haloperidol  lactate (HALDOL ) injection 5 mg  5 mg Intramuscular TID PRN White, Patrice L, NP       And   diphenhydrAMINE  (BENADRYL ) injection 50 mg  50 mg Intramuscular TID PRN White, Patrice L, NP       And   LORazepam  (ATIVAN ) injection 2 mg  2 mg Intramuscular TID PRN White, Patrice L, NP       haloperidol  lactate (HALDOL ) injection 10 mg  10 mg Intramuscular TID PRN White, Patrice L, NP       And   diphenhydrAMINE  (BENADRYL ) injection 50 mg  50 mg Intramuscular TID PRN White, Patrice L, NP       And   LORazepam  (ATIVAN ) injection 2 mg  2 mg Intramuscular TID PRN White, Patrice L, NP       feeding supplement (ENSURE PLUS HIGH PROTEIN) liquid 237 mL  237 mL Oral BID BM Trudy Carwin, NP   237 mL at 10/22/23 1355   hydrOXYzine  (ATARAX ) tablet 25 mg  25 mg Oral TID PRN Teresa Jes L, NP   25 mg at 10/21/23 2031   magnesium  hydroxide (MILK OF MAGNESIA) suspension 30 mL  30 mL Oral Daily PRN White, Patrice L, NP       nicotine  (NICODERM CQ  - dosed in mg/24 hours) patch 14 mg  14 mg Transdermal Daily Bennett, Christal H, NP   14 mg at 10/20/23 1202   nicotine  polacrilex (NICORETTE ) gum 2  mg  2 mg Oral PRN Nwoko, Agnes I, NP   2 mg at 10/22/23 1637   risperiDONE  ER (UZEDY ) SUSY 75 mg  75 mg Subcutaneous Q28 days Prentis Kitchens A, DO   75 mg at 10/19/23 1359   traZODone  (DESYREL ) tablet 50 mg  50 mg Oral QHS PRN White, Patrice L, NP   50 mg at 10/21/23 2031   PTA Medications: No medications prior to admission.    Patient Stressors: Medication change or noncompliance   Substance abuse    Patient Strengths: Ability for insight   Treatment Modalities: Medication Management, Group therapy, Case management,  1 to 1 session with clinician, Psychoeducation, Recreational therapy.   Physician Treatment Plan for Primary Diagnosis: Schizophrenia (HCC) Long Term Goal(s): Improvement in symptoms so as ready for discharge   Short Term Goals: Ability to identify changes in lifestyle to reduce recurrence of condition will improve Ability to verbalize feelings will improve Ability to demonstrate self-control will improve Ability to identify and develop effective coping behaviors will improve Ability to maintain clinical measurements within normal limits will improve Compliance with prescribed medications will improve Ability to identify  triggers associated with substance abuse/mental health issues will improve  Medication Management: Evaluate patient's response, side effects, and tolerance of medication regimen.  Therapeutic Interventions: 1 to 1 sessions, Unit Group sessions and Medication administration.  Evaluation of Outcomes: Progressing  Physician Treatment Plan for Secondary Diagnosis: Principal Problem:   Schizophrenia (HCC)  Long Term Goal(s): Improvement in symptoms so as ready for discharge   Short Term Goals: Ability to identify changes in lifestyle to reduce recurrence of condition will improve Ability to verbalize feelings will improve Ability to demonstrate self-control will improve Ability to identify and develop effective coping behaviors will improve Ability  to maintain clinical measurements within normal limits will improve Compliance with prescribed medications will improve Ability to identify triggers associated with substance abuse/mental health issues will improve     Medication Management: Evaluate patient's response, side effects, and tolerance of medication regimen.  Therapeutic Interventions: 1 to 1 sessions, Unit Group sessions and Medication administration.  Evaluation of Outcomes: Progressing   RN Treatment Plan for Primary Diagnosis: Schizophrenia (HCC) Long Term Goal(s): Knowledge of disease and therapeutic regimen to maintain health will improve  Short Term Goals: Ability to remain free from injury will improve, Ability to verbalize frustration and anger appropriately will improve, Ability to verbalize feelings will improve, and Ability to disclose and discuss suicidal ideas  Medication Management: RN will administer medications as ordered by provider, will assess and evaluate patient's response and provide education to patient for prescribed medication. RN will report any adverse and/or side effects to prescribing provider.  Therapeutic Interventions: 1 on 1 counseling sessions, Psychoeducation, Medication administration, Evaluate responses to treatment, Monitor vital signs and CBGs as ordered, Perform/monitor CIWA, COWS, AIMS and Fall Risk screenings as ordered, Perform wound care treatments as ordered.  Evaluation of Outcomes: Progressing   LCSW Treatment Plan for Primary Diagnosis: Schizophrenia (HCC) Long Term Goal(s): Safe transition to appropriate next level of care at discharge, Engage patient in therapeutic group addressing interpersonal concerns.  Short Term Goals: Engage patient in aftercare planning with referrals and resources, Increase ability to appropriately verbalize feelings, Facilitate acceptance of mental health diagnosis and concerns, and Identify triggers associated with mental health/substance abuse  issues  Therapeutic Interventions: Assess for all discharge needs, 1 to 1 time with Social worker, Explore available resources and support systems, Assess for adequacy in community support network, Educate family and significant other(s) on suicide prevention, Complete Psychosocial Assessment, Interpersonal group therapy.  Evaluation of Outcomes: Progressing   Progress in Treatment: Attending groups: attended some groups Participating in groups: Yes Taking medication as prescribed: Yes. Toleration medication: Yes. Family/Significant other contact made: Yes, contacted:  Jerona (father) [phone] and  Rawland Severin (mom) [phone] Patient understands diagnosis: Yes. Discussing patient identified problems/goals with staff: Yes. Medical problems stabilized or resolved: Yes. Denies suicidal/homicidal ideation: Yes. Issues/concerns per patient self-inventory: No.   New problem(s) identified: No, Describe:  None   New Short Term/Long Term Goal(s): detox, medication management for mood stabilization; elimination of SI thoughts; development of comprehensive mental wellness/sobriety plan   Patient Goals: nothing   Discharge Plan or Barriers: Patient recently admitted. CSW will continue to follow and assess for appropriate referrals and possible discharge planning.      Reason for Continuation of Hospitalization: Delusions  Medication stabilization Other; describe Mood stabilization, discharge planning   Estimated Length of Stay: 1 - 2 DAYS  Last 3 [place] Suicide Severity Risk Score: Flowsheet Row [facility] (Current) [facility] from 10[facility]17/2023 in  [facility] ED from 07/18/2023 in [facility] Emergency Department [facility]  C-SSRS RISK CATEGORY No Risk No Risk No Risk    Last [facility] 2/9 Scores:     No data to display          Scribe for Treatment Team: Olar Santini O Dawood Spitler,  LCSWA 10/22/2023 7:28 PM

## 2023-10-22 NOTE — Group Note (Signed)
 Date:  10/22/2023 Time:  9:11 AM  Group Topic/Focus:  Goals Group:   The focus of this group is to help patients establish daily goals to achieve during treatment and discuss how the patient can incorporate goal setting into their daily lives to aide in recovery.    Participation Level:  Did Not Attend  Participation Quality:  did not attend  Affect:   did not attend  Cognitive:   did not attend  Insight: None  Engagement in Group:   did not attend  Modes of Intervention:   did not attend  Additional Comments:   did not attend  [name] E Nakima Fluegge 10/22/2023, 9:11 AM

## 2023-10-22 NOTE — Plan of Care (Signed)
  Problem: Education: Goal: Emotional status will improve Outcome: Progressing Goal: Mental status will improve Outcome: Progressing   Problem: Coping: Goal: Coping ability will improve Outcome: Progressing Goal: Will verbalize feelings Outcome: Progressing   

## 2023-10-22 NOTE — Progress Notes (Signed)
   10/22/23 1400  Psych Admission Type (Psych Patients Only)  Admission Status Involuntary  Psychosocial Assessment  Patient Complaints Anxiety;Worrying  Eye Contact Fair  Facial Expression Anxious;Flat  Affect Appropriate to circumstance  Speech Soft  Interaction Assertive  Motor Activity Restless  Appearance/Hygiene Unremarkable  Behavior Characteristics Cooperative  Mood Anxious  Aggressive Behavior  Effect No apparent injury  Thought Process  Coherency Flight of ideas;Circumstantial  Content Preoccupation  Delusions None reported or observed  Perception WDL  Hallucination None reported or observed  Judgment Impaired  Confusion WDL  Danger to Self  Current suicidal ideation? Denies  Agreement Not to Harm Self Yes  Danger to Others  Danger to Others None reported or observed  Danger to Others Abnormal  Harmful Behavior to others No threats or harm toward other people  Destructive Behavior No threats or harm toward property

## 2023-10-22 NOTE — Plan of Care (Signed)
   Problem: Education: Goal: Knowledge of [facility] Education information/materials will improve Outcome: Progressing Goal: Emotional status will improve Outcome: Progressing Goal: Mental status will improve Outcome: Progressing Goal: Verbalization of understanding the information provided will improve Outcome: Progressing

## 2023-10-22 NOTE — [facility] Group Notes (Signed)
 Spirituality group facilitated by Elia Rockie Sofia, BCC.  Group Description: Group focused on topic of hope. Patients participated in facilitated discussion around topic, connecting with one another around experiences and definitions for hope. Group members engaged with visual explorer photos, reflecting on what hope looks like for them today. Group engaged in discussion around how their definitions of hope are present today in hospital.  Modalities: Psycho-social ed, Adlerian, Narrative, MI  Patient Progress: Emma Wilcox attended group and actively participated in group conversation and activities. She had a difficult time not interrupting others which led to some difficult encounters with another patient.

## 2023-10-22 NOTE — Progress Notes (Signed)
 [facility] MD Progress Note  10/22/2023 10:48 AM Emma Wilcox  MRN:  [id number]  Reason for Admission:   Emma Wilcox is a 28 year old female with a psychiatric history notable for schizoaffective disorder and cocaine use disorder. She presented to [facility] on October 17, 2023 under involuntary commitment petitioned by her father. She was subsequently admitted to the [facility] on October 18, 2023 for psychiatric stabilization.    Chart Review from last 24 hours:  The patient's chart was reviewed and nursing notes were reviewed. The patient's case was discussed in multidisciplinary team meeting.    - Overnight events to report per chart review / staff report: no notable overnight events to report - Patient received all scheduled medications - Per [facility] patient received PRN trazodone  for insomnia, and hydroxyzine  for anxiety, nicotine  gum for nicotine  dependence, and Tylenol  for mild pain.   Today's assessment notes: On assessment today, the pt reports that her mood is euthymic, improved since admission, and stable. Denies feeling down, depressed, or sad. Reports that anxiety symptoms are at manageable level. Sleep is stable, nursing staff report patient sleeping over 6.5 hours last night and feeling restful. Appetite is stable. Concentration is without complaint. Energy level is adequate. Denies having any suicidal thoughts. Denies having any suicidal intent and plan. Denies having any SI, HI or AVH. Denies having side effects to current psychiatric medications.   Observed patient attending and participating group sessions and therapeutic milieu and receiving positive feedback.  The patient shared that she has learned to accept herself as she is and not to judge others.  She stated she is not interested in addiction treatment following discharge but expressed interest in finding employment and engaging in therapy.  Will coordinate with Social Work for referral to outpatient  therapy. The patient continues to show clinical progress.  No overt psychotic symptoms, manic symptoms, or dangerous behaviors.  She received her monthly Uzedy  injection on 10/19/2023 at 1359. The case was reviewed with the attending psychiatrist, with plan to continue current treatment regimen.  Discussed discharge planning:   How to identify the signs of impending crisis, use of internal coping strategies, reaching out to friends and family that can help navigate a crisis, and a list of mental health professionals and agencies to call. Further to follow up on her mental health appointments and her PCP appointments.   October 21, 2023 at 5 PM: Patient's father  Emma Wilcox ([facility]C  petitioner), at [phone] called for collateral regarding pending discharge.  Father reports that he has been visiting the patient while in the hospital and has noted some improvement in her mood and thought processes.  Reports she could be discharged this weekend and he will be able to pick up the patient to return to his home.  October 20, 2023 1523:  Attempted to contact the patient's father, Emma Wilcox (IVC  petitioner), at [phone].  Unable to leave a message as the voicemail box was full.  The care team will continue efforts to reach him in order to obtain feedback.   Principal Problem: Schizophrenia (HCC) Diagnosis: Principal Problem:   Schizophrenia (HCC)  Total Time spent with patient: 45 minutes  Past Psychiatric History: See H&p  Past Medical History:  Past Medical History:  Diagnosis Date   Medical history non-contributory     Past Surgical History:  Procedure Laterality Date   MOUTH SURGERY     Family History:  Family History  Problem Relation Age of Onset  Heart disease Mother    Healthy Father    Family Psychiatric  History: See H&P  Social History:  Social History   Substance and Sexual Activity  Alcohol Use Yes     Social History   Substance and Sexual Activity  Drug Use Yes    Types: Marijuana    Social History   Socioeconomic History   Marital status: Single    Spouse name: Not on file   Number of children: Not on file   Years of education: Not on file   Highest education level: Not on file  Occupational History   Not on file  Tobacco Use   Smoking status: Every Day    Current packs/day: 0.50    Average packs/day: 0.5 packs/day for 4.0 years (2.0 ttl pk-yrs)    Types: Cigarettes   Smokeless tobacco: Never  Substance and Sexual Activity   Alcohol use: Yes   Drug use: Yes    Types: Marijuana   Sexual activity: Yes    Birth control/protection: None  Other Topics Concern   Not on file  Social History Narrative   Not on file   Social Drivers of Health   Financial Resource Strain: Not on file  Food Insecurity: No Food Insecurity (10/17/2023)   Hunger Vital Sign    Worried About Running Out of Food in the Last Year: Never true    Ran Out of Food in the Last Year: Never true  Transportation Needs: No Transportation Needs (10/17/2023)   PRAPARE - Administrator, Civil Service (Medical): No    Lack of Transportation (Non-Medical): No  Physical Activity: Not on file  Stress: Not on file  Social Connections: Patient Unable To Answer (10/18/2023)   Social Connection and Isolation Panel    Frequency of Communication with Friends and Family: Patient unable to answer    Frequency of Social Gatherings with Friends and Family: Patient unable to answer    Attends Religious Services: Patient unable to answer    Active Member of Clubs or Organizations: Patient unable to answer    Attends Banker Meetings: Patient unable to answer    Marital Status: Patient unable to answer   Additional Social History:    Sleep: Good Estimated Sleeping Duration (Last 24 Hours): 5.00-7.25 hours  Appetite:  Fair  Current Medications: Current Facility-Administered Medications  Medication Dose Route Frequency Provider Last Rate Last Admin    acetaminophen  (TYLENOL ) tablet 650 mg  650 mg Oral Q6H PRN White, Patrice L, NP   650 mg at 10/22/23 0644   alum & mag hydroxide-simeth (MAALOX/MYLANTA) 200-200-20 MG/5ML suspension 30 mL  30 mL Oral Q4H PRN White, Patrice L, NP       haloperidol  (HALDOL ) tablet 5 mg  5 mg Oral TID PRN White, Patrice L, NP   5 mg at 10/19/23 2056   And   diphenhydrAMINE  (BENADRYL ) capsule 50 mg  50 mg Oral TID PRN Teresa Jes L, NP   50 mg at 10/19/23 2056   haloperidol  lactate (HALDOL ) injection 5 mg  5 mg Intramuscular TID PRN White, Patrice L, NP       And   diphenhydrAMINE  (BENADRYL ) injection 50 mg  50 mg Intramuscular TID PRN White, Patrice L, NP       And   LORazepam  (ATIVAN ) injection 2 mg  2 mg Intramuscular TID PRN White, Patrice L, NP       haloperidol  lactate (HALDOL ) injection 10 mg  10 mg Intramuscular TID PRN  White, Patrice L, NP       And   diphenhydrAMINE  (BENADRYL ) injection 50 mg  50 mg Intramuscular TID PRN White, Patrice L, NP       And   LORazepam  (ATIVAN ) injection 2 mg  2 mg Intramuscular TID PRN White, Patrice L, NP       feeding supplement (ENSURE PLUS HIGH PROTEIN) liquid 237 mL  237 mL Oral BID BM Trudy Carwin, NP   237 mL at 10/20/23 1506   hydrOXYzine  (ATARAX ) tablet 25 mg  25 mg Oral TID PRN White, Patrice L, NP   25 mg at 10/21/23 2031   magnesium  hydroxide (MILK OF MAGNESIA) suspension 30 mL  30 mL Oral Daily PRN White, Patrice L, NP       nicotine  (NICODERM CQ  - dosed in mg/24 hours) patch 14 mg  14 mg Transdermal Daily Bennett, Christal H, NP   14 mg at 10/20/23 1202   nicotine  polacrilex (NICORETTE ) gum 2 mg  2 mg Oral PRN Nwoko, Agnes I, NP   2 mg at 10/22/23 0831   risperiDONE  ER (UZEDY ) SUSY 75 mg  75 mg Subcutaneous Q28 days Prentis Kitchens A, DO   75 mg at 10/19/23 1359   traZODone  (DESYREL ) tablet 50 mg  50 mg Oral QHS PRN White, Patrice L, NP   50 mg at 10/21/23 2031   Lab Results:  No results found for this or any previous visit (from the past 48 hours).  Blood  Alcohol level:  Lab Results  Component Value Date   [facility] <15 10/17/2023   ETH <10 09/21/2022   Metabolic Disorder Labs: Lab Results  Component Value Date   HGBA1C 5.0 10/17/2023   MPG 96.8 10/17/2023   MPG 120 09/24/2022   No results found for: PROLACTIN Lab Results  Component Value Date   CHOL 141 10/17/2023   TRIG 44 10/17/2023   HDL 46 10/17/2023   CHOLHDL 3.1 10/17/2023   VLDL 9 10/17/2023   LDLCALC 86 10/17/2023   LDLCALC 88 09/23/2022   Physical Findings: AIMS:  ,  ,  0,  ,  ,  ,   CIWA:   N/A COWS:  N/A   Musculoskeletal: Strength & Muscle Tone: within normal limits Gait & Station: normal Patient leans: N/A  Psychiatric Specialty Exam:  Presentation  General Appearance:  Appropriate for Environment; Casual; Fairly Groomed  Eye Contact: Good  Speech: Clear and Coherent  Speech Volume: Normal  Handedness: Right  Mood and Affect  Mood: Euthymic  Affect: Appropriate; Congruent  Thought Process  Thought Processes: Coherent; Goal Directed; Linear  Descriptions of Associations:Intact  Orientation:Full (Time, Place and Person)  Thought Content:Logical  History of Schizophrenia/Schizoaffective disorder:Yes  Duration of Psychotic Symptoms:Greater than six months  Hallucinations:Hallucinations: None  Ideas of Reference:None  Suicidal Thoughts:Suicidal Thoughts: No  Homicidal Thoughts:Homicidal Thoughts: No  Sensorium  Memory: Immediate Good; Recent Good  Judgment: Fair  Insight: Fair  Art therapist  Concentration: Good  Attention Span: Good  Recall: Fair  Fund of Knowledge: Fair  Language: Good  Psychomotor Activity  Psychomotor Activity:Psychomotor Activity: Normal  Assets  Assets: Communication Skills; Desire for Improvement; Housing; Physical Health; Resilience  Sleep  Sleep:Sleep: Good Number of Hours of Sleep: 6.5  Physical Exam: Physical Exam Vitals and nursing note reviewed.   Constitutional:      General: She is not in acute distress. HENT:     Head: Normocephalic.     Right Ear: External ear normal.     Left  Ear: External ear normal.     Nose: Nose normal.     Mouth/Throat:     Mouth: Mucous membranes are moist.     Pharynx: Oropharynx is clear.  Eyes:     Extraocular Movements: Extraocular movements intact.  Cardiovascular:     Rate and Rhythm: Normal rate.     Pulses: Normal pulses.  Pulmonary:     Effort: Pulmonary effort is normal. No respiratory distress.  Abdominal:     Comments: Deferred  Genitourinary:    Comments: Deferred Musculoskeletal:        General: Normal range of motion.     Cervical back: Normal range of motion.  Skin:    General: Skin is warm.  Neurological:     General: No focal deficit present.     Mental Status: She is alert and oriented to person, place, and time.  Psychiatric:        Mood and Affect: Mood normal.        Behavior: Behavior normal.        Thought Content: Thought content normal.    Review of Systems  Constitutional:  Negative for chills, diaphoresis and fever.  HENT:  Negative for congestion and sore throat.   Respiratory:  Negative for cough and shortness of breath.   Cardiovascular:  Negative for chest pain and palpitations.  Gastrointestinal:  Negative for abdominal pain, constipation, diarrhea, nausea and vomiting.  Genitourinary:  Negative for dysuria.  Skin: Negative.   Neurological:  Negative for dizziness, tingling, tremors and headaches.  Psychiatric/Behavioral:  Positive for depression and substance abuse. Negative for hallucinations, memory loss and suicidal ideas. The patient is nervous/anxious and has insomnia.    Blood pressure 116/77, pulse 77, temperature 98.5 F (36.9 C), temperature source Oral, resp. rate 14, height 5' 2 (1.575 m), weight 54.4 kg, SpO2 100%. Body mass index is 21.95 kg/m.  Treatment Plan Summary: Daily contact with patient to assess and evaluate symptoms and  progress in treatment and Medication management  Diagnoses / Active Problems: Principal Problem:   Schizophrenia (HCC)   PLAN: Safety and Monitoring:             -- Involuntary admission to inpatient psychiatric unit for safety, stabilization and treatment             -- Daily contact with patient to assess and evaluate symptoms and progress in treatment             -- Patient's case to be discussed in multi-disciplinary team meeting             -- Observation Level : q15 minute checks             -- Vital signs:  q12 hours             -- Precautions: suicide, elopement, and assault   2. Psychiatric Diagnoses and Treatment:  # Schizophrenia  -- Discontinue oral Risperdal  (7/1) --Continue Uzedy  75 mg LAI monthly (first dose 7/01)    --  The risks/benefits/side-effects/alternatives to this medication were discussed in detail with the patient and time was given for questions. The patient consents to medication trial.  -- FDA             -- Metabolic profile and EKG monitoring obtained while on an atypical antipsychotic (BMI: Lipid Panel: HbgA1c: QTc:)              -- Encouraged patient to participate in unit milieu and in scheduled group therapies              --  Short Term Goals: Ability to identify changes in lifestyle to reduce recurrence of condition will improve, Ability to verbalize feelings will improve, Ability to demonstrate self-control will improve, Ability to identify and develop effective coping behaviors will improve, Ability to maintain clinical measurements within normal limits will improve, Compliance with prescribed medications will improve, and Ability to identify triggers associated with substance abuse/mental health issues will improve             -- Long Term Goals: Improvement in symptoms so as ready for discharge              3. Medical Issues Being Addressed:              Tobacco Use Disorder             -- Nicotine  patch 14 mg/24 hours ordered             --  Smoking cessation encouraged               # Nutritional supplementation -- Continue Ensure 2 times daily between meals   Labs and EKG reviewed  4. Discharge Planning:              -- Social work and case management to assist with discharge planning and identification of hospital follow-up needs prior to discharge             -- Estimated LOS: 5-7 days             -- Discharge Concerns: Need to establish a safety plan; Medication compliance and effectiveness             -- Discharge Goals: Return home with outpatient referrals for mental health follow-up including medication management/psychotherapy    I certify that inpatient services furnished can reasonably be expected to improve the patient's condition.    Ellouise JAYSON Azure, FNP 10/22/2023, 10:48 AM Patient ID: Logan Cancer, female   DOB: July 01, 1995, 28 y.o.   MRN: [id number] Patient ID: Klaryssa Fauth, female   DOB: 01/14/1996, 28 y.o.   MRN: [id number] Patient ID: Jazsmin Couse, female   DOB: [birth year], 28 y.o.   MRN: [id number]

## 2023-10-23 DIAGNOSIS — F200 Paranoid schizophrenia: Secondary | ICD-10-CM

## 2023-10-23 MED ORDER — RISPERIDONE ER 75 MG/0.21ML ~~LOC~~ SUSY: 75.0000 mg | PREFILLED_SYRINGE | SUBCUTANEOUS | 0 refills | Status: AC

## 2023-10-23 MED ORDER — NICOTINE 14 MG/24HR TD PT24: 14.0000 mg | MEDICATED_PATCH | Freq: Every day | TRANSDERMAL | 0 refills | Status: AC

## 2023-10-23 NOTE — Progress Notes (Signed)
  [facility] Adult Case Management Discharge Plan :  Will you be returning to the same living situation after discharge:  Yes,  [address]  At discharge, do you have transportation home?: Yes,  father will be picking her up at 12 Do you have the ability to pay for your medications: Yes,  [employer] / [employer]  Release of information consent forms completed and in the chart;  Patient's signature needed at discharge.  Patient to Follow up at:  Follow-up Information     [facility] Follow up on 10/29/2023.   Why: You have a hospital follow up appointment for therapy and medication management services on  10/29/23 at 2:30 pm.  This will be a Virtual telehealth appointment. Contact information: [address]  [address] [place] [address] [phone]                 Next level of care provider has access to [facility] Link:no  Safety Planning and Suicide Prevention discussed: Yes,        Has patient been referred to the Quitline?: Yes, faxed/e-referral on 10/23/23  Patient has been referred for addiction treatment: Yes, the patient will follow up with an outpatient provider for substance use disorder. Therapist: appointment made  Golda Louder, LCSWA 10/23/2023, 10:59 AM
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
## 2023-10-23 NOTE — Progress Notes (Signed)
 D: Pt A & O X 3. Denies SI, HI, AVH and pain at this time. D/C home as ordered. Picked up in lobby by her father.  A: D/C instructions reviewed with pt including prescriptions, medication samples and follow up appointment, compliance encouraged. All belongings from locker 35 given to pt at time of departure. Safety checks maintained without incident till time of d/c.  R: Pt receptive to care. Denies adverse drug reactions when assessed. Verbalized understanding related to d/c instructions. Signed belonging sheet in agreement with items received from locker. Ambulatory with a steady gait. Appears to be in no physical distress at time of departure.

## 2023-10-23 NOTE — BHH Suicide Risk Assessment (Signed)
 [facility] Discharge Suicide Risk Assessment   Principal Problem: Schizophrenia [facility]) Discharge Diagnoses: Principal Problem:   Schizophrenia (HCC)   Total Time spent with patient: 30 minutes  Musculoskeletal: Strength & Muscle Tone: within normal limits Gait & Station: normal Patient leans: N/A  Psychiatric Specialty Exam  Presentation  General Appearance:  Casually dressed, not in any distress, appropriate behavior, engaged politely.  No EPS.  Eye Contact: Good.  Speech: Spontaneous.  Normal rate, tone and volume.  Normal prosody of speech.  Mood and Affect  Mood: Euthymic.  Affect: Full range and appropriate.  Thought Process  Thought Processes: Linear and goal directed.  Descriptions of Associations:Intact  Orientation:Full (Time, Place and Person)  Thought Content: Future oriented.  No current suicidal thoughts.  No homicidal thoughts.  No thoughts of violence.  No negative ruminative flooding.  No guilty ruminations.  No delusional theme.  No obsessions.  Hallucinations: No hallucination in any modality.  Sensorium  Memory: Good.  Judgment: Good.  Insight: Good  Executive Functions  Concentration: Good.  Attention Span: Good.  Recall: Good.  Fund of Knowledge: Good.  Language: Good   Psychomotor Activity  Normal psychomotor activity    Sleep  Sleep: Sleep: Good  Estimated Sleeping Duration (Last 24 Hours): 6.25-8.50 hours  Physical Exam: Physical Exam ROS Blood pressure (!) 125/91, pulse 71, temperature 98.1 F (36.7 C), temperature source Oral, resp. rate 16, height 5' 2 (1.575 m), weight 54.4 kg, SpO2 100%. Body mass index is 21.95 kg/m.  Mental Status Per Nursing Assessment::   On Admission:  NA  Demographic Factors:  Adolescent or young adult  Loss Factors: NA  Historical Factors: Family history of mental illness or substance abuse  Risk Reduction Factors:   Living with another person, especially a  relative, Positive social support, Positive therapeutic relationship, and Positive coping skills or problem solving skills  Continued Clinical Symptoms:  Psychosis has completely resolved.  No manic features.  No evidence of depression.  No overwhelming anxiety.  Sleep-wake cycle is well-regulated.  Cognitive Features That Contribute To Risk:  None    Suicide Risk:  Minimal: No current suicidal thoughts.  No homicidal thoughts.  No thoughts of violence.  Modifiable risk factor targeted during this admission as psychosis and mania.  Patient is not a longer acting injectable.  She has responded well to treatment.  Her family is supportive.  No new psychosocial stressor.  Patient is aware of risk of accidental overdose from previously tolerated opioids. At this point in time, patient is stable for care at the lower setting.  Follow-up Information     [facility] Follow up on 10/29/2023.   Why: You have a hospital follow up appointment for therapy and medication management services on  10/29/23 at 2:30 pm.  This will be a Virtual telehealth appointment. Contact information: [address]  [address] [place] [address] [phone]                 Plan Of Care/Follow-up recommendations:  See discharge summary  Jerrell DELENA Forehand, MD 10/23/2023, 10:37 AM

## 2023-10-23 NOTE — BHH Group Notes (Signed)
 Adult Psychoeducational Group Note  Date:  10/23/2023 Time:  12:13 PM  Group Topic/Focus:  Goals Group:   The focus of this group is to help patients establish daily goals to achieve during treatment and discuss how the patient can incorporate goal setting into their daily lives to aide in recovery.  Participation Level:  Active  Participation Quality:  Appropriate  Affect:  Appropriate  Cognitive:  Appropriate  Insight: Appropriate  Engagement in Group:  Engaged  Modes of Intervention:  Discussion  Additional Comments:  Pt attended the goals group and remained appropriate and engaged throughout the duration of the group.   Shalane Florendo O 10/23/2023, 12:13 PM

## 2023-12-30 ENCOUNTER — Ambulatory Visit
